# Patient Record
Sex: Female | Born: 1990 | Hispanic: Yes | Smoking: Never smoker
Health system: Southern US, Community
[De-identification: ages and names within clinical notes are randomized; demographics above are authoritative.]

---

## 2010-05-09 ENCOUNTER — Ambulatory Visit: Payer: Self-pay | Admitting: Family Medicine

## 2010-08-18 IMAGING — US US OB US >=[ID] SNGL FETUS
1 series · 17 of 28 positions shown · non-contrast
Comparison: none

REASON FOR EXAM: anatomy  dating
COMMENTS:

[Series 1: us ob us >=(id) sngl fetus · 17 of 96 slices shown]
[im 1/96]
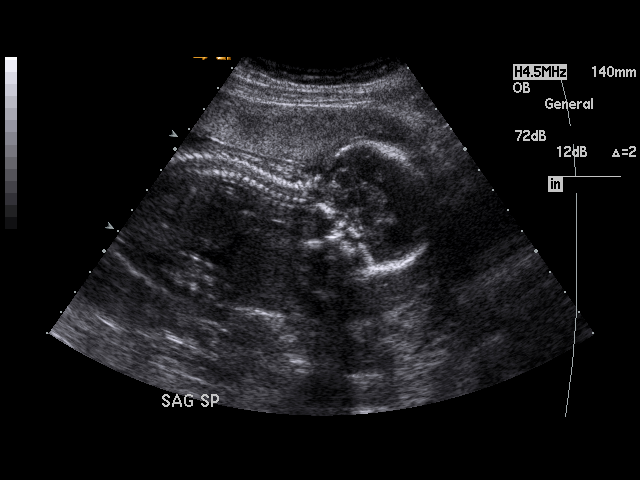
[im 8/96]
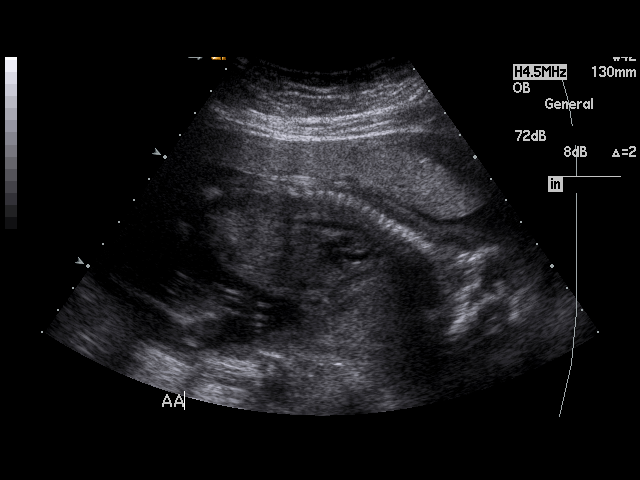
[im 15/96]
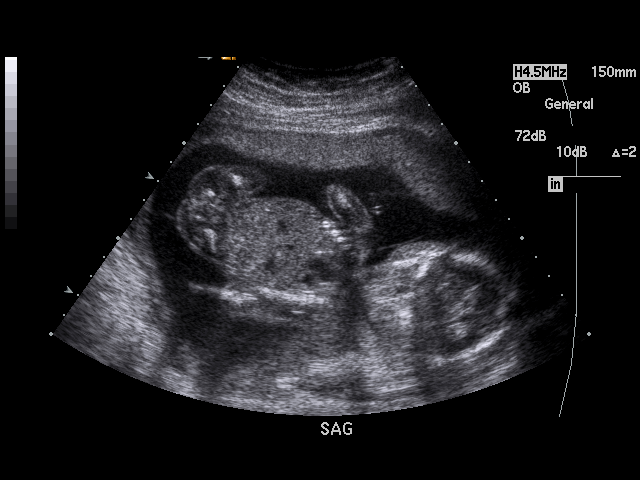
[im 18/96]
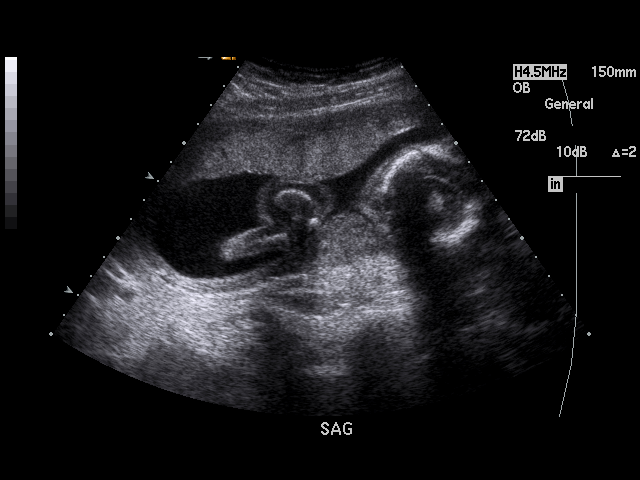
[im 25/96]
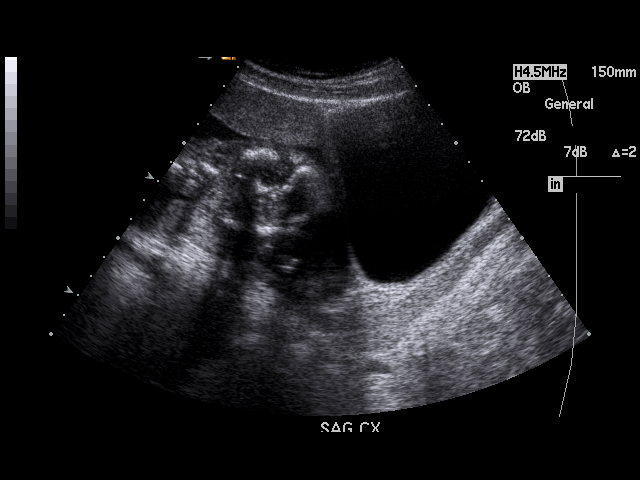
[im 32/96]
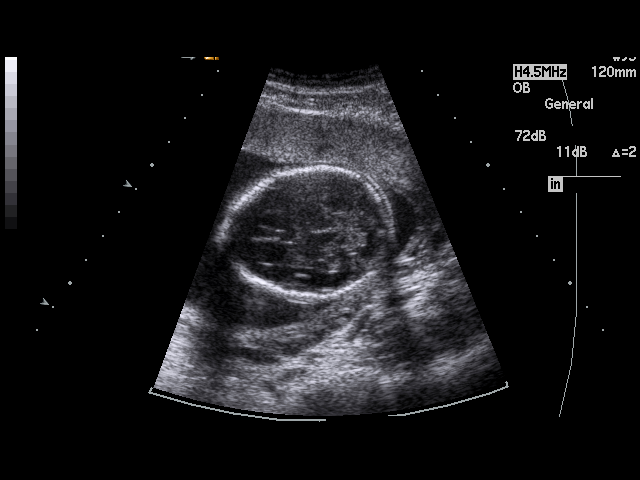
[im 36/96]
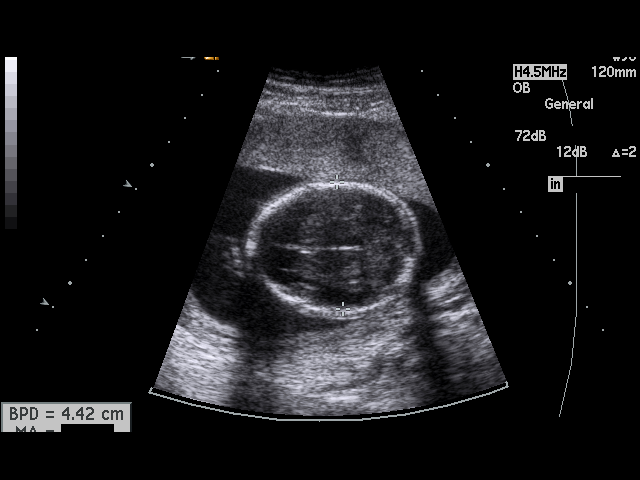
[im 43/96]
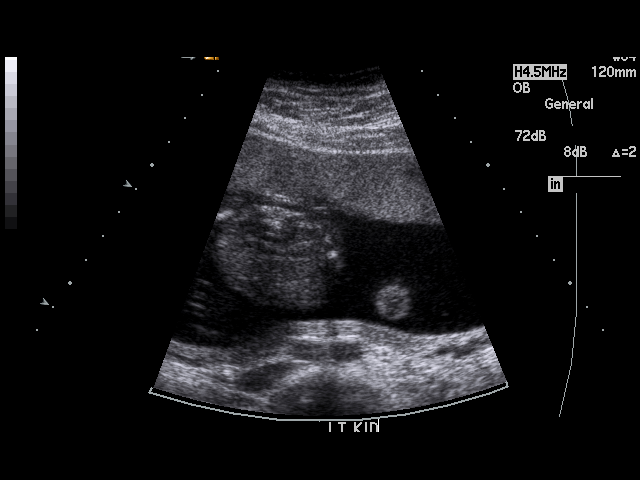
[im 50/96]
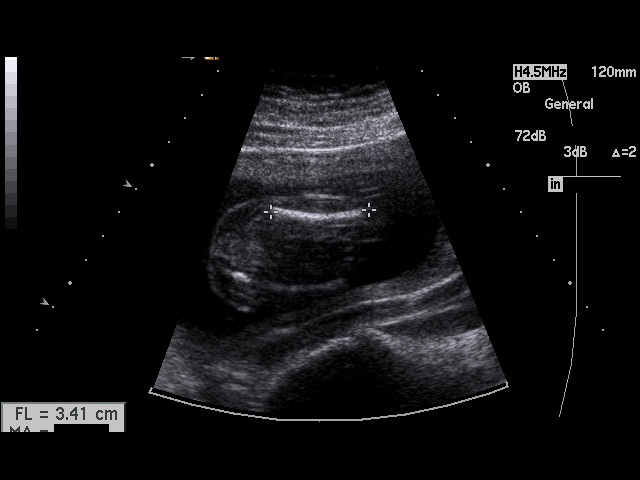
[im 53/96]
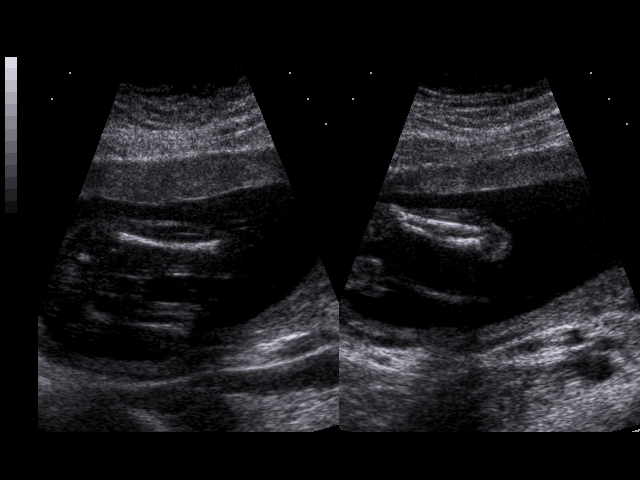
[im 60/96]
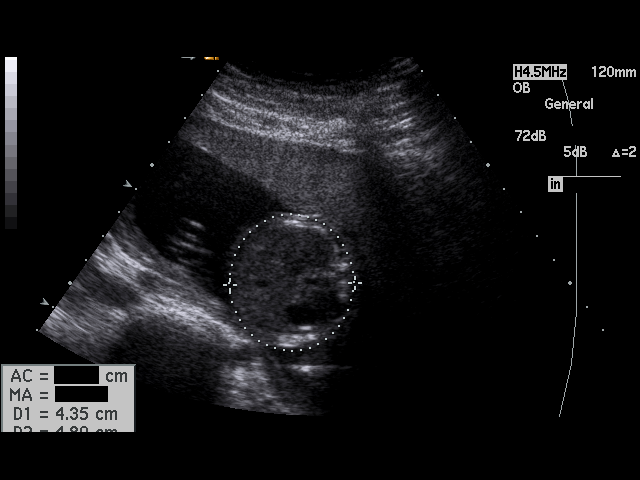
[im 64/96]
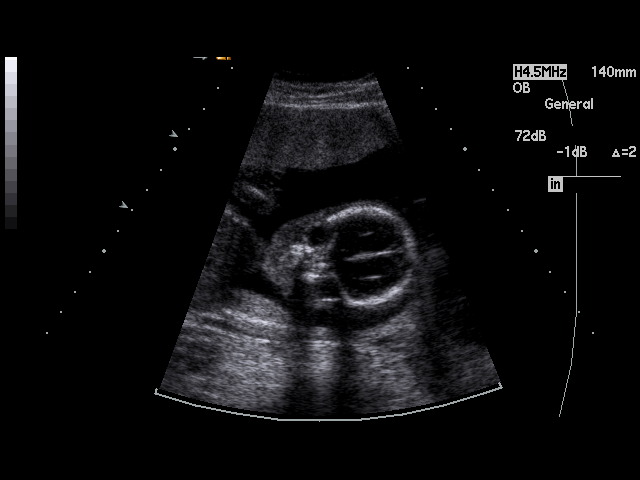
[im 71/96]
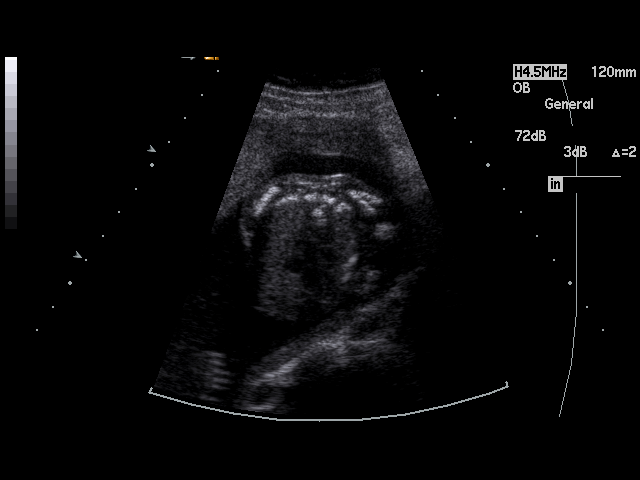
[im 78/96]
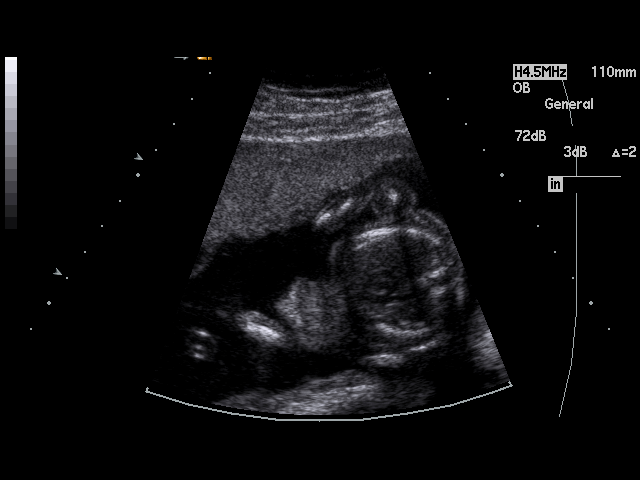
[im 81/96]
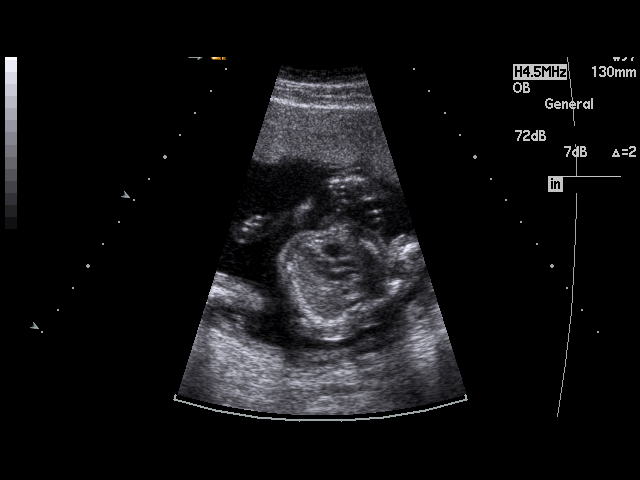
[im 88/96]
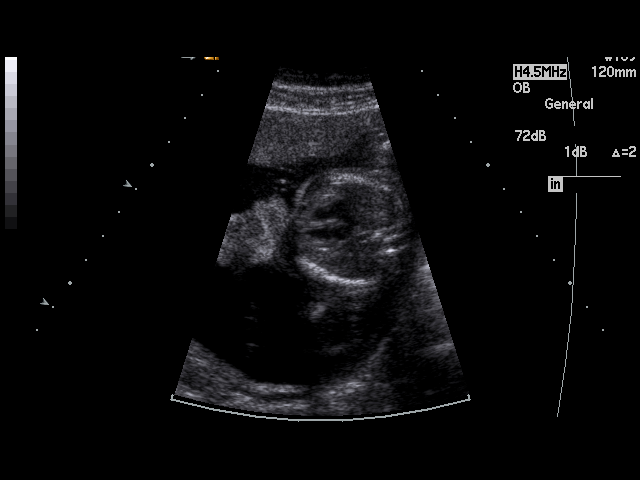
[im 96/96]
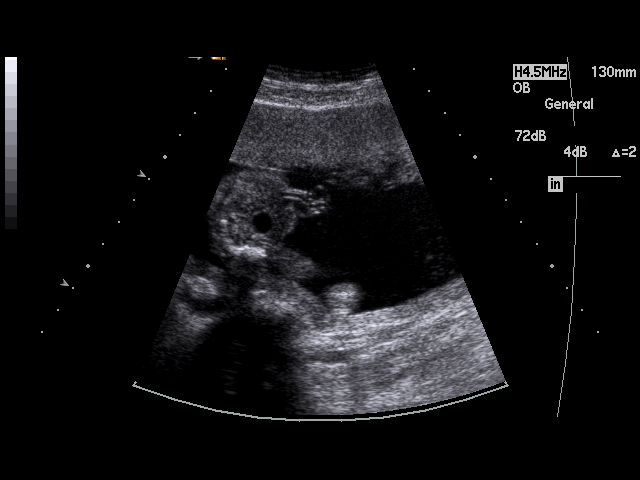

[17 of 28 positions shown; findings below may reference images not displayed]

PROCEDURE:     US  - US OB GREATER/OR EQUAL TO 6HIQ0  - May 09, 2010 [DATE]

RESULT:     There is observed a single living intrauterine gestation.
Presentation currently is cephalic. Amniotic fluid volume appears normal.
The placenta is anterior. The inferior margin of the placenta is
approximately 6.68 cm from the cervix. Fetal heart rate was monitored at 144
beats per minute. The fetal heart, stomach and urinary bladder are
visualized. No hydrocephalus or hydronephrosis is seen. No fetal
abnormalities are identified.

Fetal measurements are as follows:

          BPD is 4.45 cm, corresponding to 19 weeks, 3 days
            HC is 17.34 cm, corresponding to 19 weeks, 6 days
           AC is 14.73 cm, corresponding to 20 weeks, 0 days.
           FL is 3.33 cm, corresponding to 20 weeks, 3 days

EFW is 359 grams + / - 48 grams. Average ultrasound age is 20 weeks, 0 days.
Ultrasound EDD is September 26, 2010.
IMPRESSION: Please see above.

## 2010-09-29 ENCOUNTER — Inpatient Hospital Stay: Payer: Self-pay

## 2020-06-17 ENCOUNTER — Ambulatory Visit (INDEPENDENT_AMBULATORY_CARE_PROVIDER_SITE_OTHER): Payer: PRIVATE HEALTH INSURANCE | Admitting: Obstetrics

## 2020-06-17 ENCOUNTER — Encounter: Payer: Self-pay | Admitting: Obstetrics

## 2020-06-17 ENCOUNTER — Other Ambulatory Visit: Payer: Self-pay

## 2020-06-17 VITALS — BP 120/80 | Ht 67.0 in | Wt 152.0 lb

## 2020-06-17 DIAGNOSIS — Z3041 Encounter for surveillance of contraceptive pills: Secondary | ICD-10-CM | POA: Diagnosis not present

## 2020-06-17 DIAGNOSIS — R300 Dysuria: Secondary | ICD-10-CM | POA: Insufficient documentation

## 2020-06-17 DIAGNOSIS — B373 Candidiasis of vulva and vagina: Secondary | ICD-10-CM | POA: Diagnosis not present

## 2020-06-17 DIAGNOSIS — N898 Other specified noninflammatory disorders of vagina: Secondary | ICD-10-CM | POA: Insufficient documentation

## 2020-06-17 DIAGNOSIS — B3731 Acute candidiasis of vulva and vagina: Secondary | ICD-10-CM | POA: Insufficient documentation

## 2020-06-17 LAB — POCT WET PREP (WET MOUNT)
Clue Cells Wet Prep Whiff POC: NEGATIVE
Trichomonas Wet Prep HPF POC: ABSENT

## 2020-06-17 LAB — POCT URINALYSIS DIPSTICK
Bilirubin, UA: NEGATIVE
Blood, UA: POSITIVE
Glucose, UA: NEGATIVE
Ketones, UA: NEGATIVE
Leukocytes, UA: NEGATIVE
Nitrite, UA: NEGATIVE
Protein, UA: NEGATIVE
Spec Grav, UA: 1.01 (ref 1.010–1.025)
Urobilinogen, UA: 0.2 E.U./dL
pH, UA: 5 (ref 5.0–8.0)

## 2020-06-17 MED ORDER — NORGESTIM-ETH ESTRAD TRIPHASIC 0.18/0.215/0.25 MG-35 MCG PO TABS: 1.0000 | ORAL_TABLET | Freq: Every day | ORAL | 6 refills | Status: DC

## 2020-06-17 MED ORDER — FLUCONAZOLE 150 MG PO TABS: 150.0000 mg | ORAL_TABLET | Freq: Once | ORAL | 0 refills | Status: AC

## 2020-06-17 NOTE — Progress Notes (Signed)
Obstetrics & Gynecology Office Visit   Chief Complaint:  Chief Complaint  Patient presents with  . Vaginitis    History of Present Illness: Michelle Clarke is a 29 yo new patient who presents with c/o some vaginal discharge, and postcoital irritation. She shares that she contracepts with a low dose OCP, and would like this renewed. 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 She has one child at home.She has not established care with a local GYN nor with a PCP. It has been many years since she had a gyn physical- since her child of 9 years was born. In addition  To asking for evaluation of possible yeast infection, she reports that post IC, she occasional has dysuria.   Review of Systems:  Review of Systems  Constitutional: Negative.   HENT: Negative.   Eyes: Negative.   Respiratory: Negative.   Cardiovascular: Negative.   Gastrointestinal: Negative.   Genitourinary: Positive for dysuria.  Skin: Negative.   Neurological: Negative.   Endo/Heme/Allergies: Negative.   Psychiatric/Behavioral: Negative.      Past Medical History:  History reviewed. No pertinent past medical history.  Past Surgical History:  History reviewed. No pertinent surgical history.  Gynecologic History: Patient's last menstrual period was 05/16/2020.  Obstetric History: G1P1001  Family History:  History reviewed. No pertinent family history.  Social History:  Social History   Socioeconomic History  . Marital status: Single    Spouse name: Not on file  . Number of children: Not on file  . Years of education: Not on file  . Highest education level: Not on file  Occupational History  . Not on file  Tobacco Use  . Smoking status: Never Smoker  . Smokeless tobacco: Never Used  Vaping Use  . Vaping Use: Never used  Substance and Sexual Activity  . Alcohol use: Never  . Drug use: Never  . Sexual activity: Yes    Birth control/protection: Pill  Other  Topics Concern  . Not on file  Social History Narrative  . Not on file   Social Determinants of Health   Financial Resource Strain:   . Difficulty of Paying Living Expenses:   Food Insecurity:   . Worried About Charity fundraiser in the Last Year:   . Arboriculturist in the Last Year:   Transportation Needs:   . Film/video editor (Medical):   Marland Kitchen Lack of Transportation (Non-Medical):   Physical Activity:   . Days of Exercise per Week:   . Minutes of Exercise per Session:   Stress:   . Feeling of Stress :   Social Connections:   . Frequency of Communication with Friends and Family:   . Frequency of Social Gatherings with Friends and Family:   . Attends Religious Services:   . Active Member of Clubs or Organizations:   . Attends Archivist Meetings:   Marland Kitchen Marital Status:   Intimate Partner Violence:   . Fear of Current or Ex-Partner:   . Emotionally Abused:   Marland Kitchen Physically Abused:   . Sexually Abused:     Allergies:  No Known Allergies  Medications: Prior to Admission medications   Medication Sig Start Date End Date Taking? Authorizing Provider  Norgestimate-Ethinyl Estradiol Triphasic (TRI-ESTARYLLA) 0.18/0.215/0.25 MG-35 MCG tablet Take 1 tablet by mouth daily. 06/17/20  Yes Imagene Riches, CNM  fluconazole (DIFLUCAN) 150 MG tablet Take 1 tablet (150 mg total) by mouth once for 1  dose. Can take additional dose three days later if symptoms persist 06/17/20 06/17/20  [name], CNM    Physical Exam Vitals:  Vitals:   06/17/20 1314  BP: 120/80   Patient's last menstrual period was 05/16/2020.  Physical Exam  Reserved to the pelvic area: Normal hair distrubution/ shaves her mons. No rashes or lesions noted on external genitalia. No externally noted discharge. Small superficial laceration noted at base of her vagina. Vaginal mucosa is pink, not ruddy. Scant amount of white discharge noted in vaginal vault; non malodorous.  See POCT wet mount  results: yeast buds noted, rare hyphae. No trich, negative whiff. Urine dip reveals some leuks, + hematuria   Assessment: 29 y.o. G1P1001, new patient for evaluation of vaginal discharge. Yeast vaginitis Desires STI screening for GC, CT  Plan: Problem List Items Addressed This Visit      Genitourinary   Yeast vaginitis   Relevant Medications   fluconazole (DIFLUCAN) 150 MG tablet     Other   Vaginal discharge - Primary   Relevant Medications   fluconazole (DIFLUCAN) 150 MG tablet   Other Relevant Orders   POCT Wet Prep [facility]) (Completed)   POCT urinalysis dipstick (Completed)   GC/Chlamydia Probe Amp   Dysuria   Relevant Orders   Culture, OB Urine    Other Visit Diagnoses    Encounter for surveillance of contraceptive pills       Relevant Medications   Norgestimate-Ethinyl Estradiol Triphasic (TRI-ESTARYLLA) 0.18/0.215/0.25 MG-35 MCG tablet     P: Aptima probe sent. Wet mount performed. Rx for her OCPs renewed for 6 months. Urine sent for culture. Will follow up with Rx if culture is + Encouraged to make appointment for an annual Gyn PE. [name], CNM  06/17/2020 4:41 PM

## 2020-06-19 LAB — CULTURE, OB URINE

## 2020-06-19 LAB — URINE CULTURE, OB REFLEX: Organism ID, Bacteria: NO GROWTH

## 2020-06-20 LAB — GC/CHLAMYDIA PROBE AMP
Chlamydia trachomatis, NAA: NEGATIVE
Neisseria Gonorrhoeae by PCR: NEGATIVE

## 2020-07-08 ENCOUNTER — Ambulatory Visit: Payer: PRIVATE HEALTH INSURANCE | Admitting: Obstetrics

## 2020-08-26 ENCOUNTER — Encounter: Payer: Self-pay | Admitting: Obstetrics

## 2020-08-26 ENCOUNTER — Other Ambulatory Visit: Payer: Self-pay

## 2020-08-26 ENCOUNTER — Ambulatory Visit (INDEPENDENT_AMBULATORY_CARE_PROVIDER_SITE_OTHER): Payer: No Typology Code available for payment source | Admitting: Obstetrics

## 2020-08-26 ENCOUNTER — Other Ambulatory Visit (HOSPITAL_COMMUNITY)
Admission: RE | Admit: 2020-08-26 | Discharge: 2020-08-26 | Disposition: A | Discharge status: Home / Self Care | Payer: PRIVATE HEALTH INSURANCE | Source: Ambulatory Visit | Attending: Obstetrics | Admitting: Obstetrics

## 2020-08-26 VITALS — BP 110/80 | Ht 67.0 in | Wt 160.0 lb

## 2020-08-26 DIAGNOSIS — Z01419 Encounter for gynecological examination (general) (routine) without abnormal findings: Secondary | ICD-10-CM

## 2020-08-26 DIAGNOSIS — Z3041 Encounter for surveillance of contraceptive pills: Secondary | ICD-10-CM | POA: Diagnosis not present

## 2020-08-26 DIAGNOSIS — Z23 Encounter for immunization: Secondary | ICD-10-CM | POA: Diagnosis not present

## 2020-08-26 MED ORDER — NORGESTIM-ETH ESTRAD TRIPHASIC 0.18/0.215/0.25 MG-35 MCG PO TABS: 1.0000 | ORAL_TABLET | Freq: Every day | ORAL | 6 refills | Status: DC

## 2020-08-26 NOTE — Progress Notes (Signed)
Gynecology Annual Exam  PCP: Patient, No Pcp Per  Chief Complaint:  Chief Complaint  Patient presents with  . Gynecologic Exam    no concerns    History of Present Illness:  Ms. Michelle Clarke is a 29 y.o. G1P1001 who LMP was Patient's last menstrual period was 08/15/2020 (exact date)., presents today for her annual examination.  Her menses are regular every 28-30 days, lasting 4 day(s).  Dysmenorrhea none. She doesnot have intermenstrual bleeding.  She is single partner, contraception - OCP (estrogen/progesterone).  Last Pap: not recorded  Results were: NA  Hx of STDs: none  There is no FH of breast cancer. There is no FH of ovarian cancer. The patient does do self-breast exams.  Tobacco use: The patient denies current or previous tobacco use. Alcohol use: social drinker Exercise: moderately active    The patient wears seatbelts: yes.   The patient reports that domestic violence in her life is absent.   History reviewed. No pertinent past medical history.  History reviewed. No pertinent surgical history.  Prior to Admission medications   Medication Sig Start Date End Date Taking? Authorizing Provider  Norgestimate-Ethinyl Estradiol Triphasic (TRI-ESTARYLLA) 0.18/0.215/0.25 MG-35 MCG tablet Take 1 tablet by mouth daily. 08/26/20  Yes [name], CNM    No Known Allergies  Gynecologic History: Patient's last menstrual period was 08/15/2020 (exact date). History of abnormal pap smear: No History of STI: No   Obstetric History: G1P1001  Social History   Socioeconomic History  . Marital status: Single    Spouse name: Not on file  . Number of children: Not on file  . Years of education: Not on file  . Highest education level: Not on file  Occupational History  . Not on file  Tobacco Use  . Smoking status: Never Smoker  . Smokeless tobacco: Never Used  Vaping Use  . Vaping Use: Never used  Substance and Sexual Activity  . Alcohol use: Never  . Drug use:  Never  . Sexual activity: Yes    Birth control/protection: Pill  Other Topics Concern  . Not on file  Social History Narrative  . Not on file   Social Determinants of Health   Financial Resource Strain:   . Difficulty of Paying Living Expenses: Not on file  Food Insecurity:   . Worried About Programme researcher, broadcasting/film/video in the Last Year: Not on file  . Ran Out of Food in the Last Year: Not on file  Transportation Needs:   . Lack of Transportation (Medical): Not on file  . Lack of Transportation (Non-Medical): Not on file  Physical Activity:   . Days of Exercise per Week: Not on file  . Minutes of Exercise per Session: Not on file  Stress:   . Feeling of Stress : Not on file  Social Connections:   . Frequency of Communication with Friends and Family: Not on file  . Frequency of Social Gatherings with Friends and Family: Not on file  . Attends Religious Services: Not on file  . Active Member of Clubs or Organizations: Not on file  . Attends Banker Meetings: Not on file  . Marital Status: Not on file  Intimate Partner Violence:   . Fear of Current or Ex-Partner: Not on file  . Emotionally Abused: Not on file  . Physically Abused: Not on file  . Sexually Abused: Not on file    History reviewed. No pertinent family history.  ROS   Physical Exam BP 110/80  Ht 5\' 7"  (1.702 m)   Wt 160 lb (72.6 kg)   LMP 08/15/2020 (Exact Date)   BMI 25.06 kg/m    OBGyn Exam  Female chaperone present for pelvic and breast  portions of the physical exam  Results:     Assessment: 29 y.o. G9P1001 female here for routine annual gynecologic examination  Plan: Problem List Items Addressed This Visit    None    Visit Diagnoses    Needs flu shot    -  Primary   Relevant Orders   Flu Vaccine QUAD 36+ mos IM (Fluarix, Quad PF) (Completed)   Women's annual routine gynecological examination       Relevant Orders   Cytology - PAP   Encounter for surveillance of contraceptive  pills       Relevant Medications   Norgestimate-Ethinyl Estradiol Triphasic (TRI-ESTARYLLA) 0.18/0.215/0.25 MG-35 MCG tablet      Screening: -- Blood pressure screen normal -- Weight screening: normal -- Depression screening negative (PHQ-9) -- Nutrition: normal -- cholesterol screening: not due for screening -- osteoporosis screening: due - will order DEXA -- tobacco screening: not using -- alcohol screening: AUDIT questionnaire indicates low-risk usage. -- family history of breast cancer screening: done. not at high risk. -- no evidence of domestic violence or intimate partner violence. -- STD screening: gonorrhea/chlamydia NAAT not collected per patient request. -- pap smear collected per ASCCP guidelines -- flu vaccine given today -- HPV vaccination series: received  01-21-1982, CNM  08/26/2020 4:45 PM   08/26/2020 4:37 PM

## 2020-08-31 ENCOUNTER — Encounter: Payer: Self-pay | Admitting: Obstetrics

## 2020-08-31 LAB — CYTOLOGY - PAP
Comment: NEGATIVE
Diagnosis: UNDETERMINED — AB
High risk HPV: NEGATIVE

## 2020-09-02 ENCOUNTER — Encounter: Payer: Self-pay | Admitting: Obstetrics

## 2020-12-24 NOTE — L&D Delivery Note (Signed)
Date of delivery: 09/19/2021 Estimated Date of Delivery: 09/19/21 Patient's last menstrual period was 01/02/2021 (within days). EGA: [redacted]w[redacted]d  Delivery Note At 3:35 AM a viable female was delivered via Vaginal, Spontaneous  Presentation: Left Occiput Anterior  APGAR: 8, 9   Weight: 3740 g, 8 pounds 4 ounces   Placenta status: Spontaneous, Intact.   Cord: 3 vessels with the following complications: none  Cord pH: NA  Called to see patient.  Mom pushed to deliver a viable female infant.  The head followed by shoulders, which delivered without difficulty, and the rest of the body.  Nuchal cord noted and reduced after delivery.  Baby to mom's chest.  Cord clamped and cut after 3 min delay.  Cord blood obtained.  Placenta delivered spontaneously, intact, with a 3-vessel cord.  First degree perineal laceration hemostatic and not requiring repair.  All counts correct.  Hemostasis obtained with IV pitocin and fundal massage.   Anesthesia: none Episiotomy: none Lacerations: 1st degree Suture Repair:  NA Est. Blood Loss (mL): 200  Mom to postpartum.  Baby to Couplet care / Skin to Skin.  Tresea Mall, CNM 09/19/2021, 4:06 AM

## 2021-03-31 ENCOUNTER — Encounter: Payer: Self-pay | Admitting: Advanced Practice Midwife

## 2021-03-31 ENCOUNTER — Other Ambulatory Visit (HOSPITAL_COMMUNITY)
Admission: RE | Admit: 2021-03-31 | Discharge: 2021-03-31 | Disposition: A | Discharge status: Home / Self Care | Payer: PRIVATE HEALTH INSURANCE | Source: Ambulatory Visit | Attending: Advanced Practice Midwife | Admitting: Advanced Practice Midwife

## 2021-03-31 ENCOUNTER — Ambulatory Visit (INDEPENDENT_AMBULATORY_CARE_PROVIDER_SITE_OTHER): Payer: PRIVATE HEALTH INSURANCE | Admitting: Advanced Practice Midwife

## 2021-03-31 ENCOUNTER — Other Ambulatory Visit: Payer: Self-pay

## 2021-03-31 VITALS — BP 120/80 | Wt 160.0 lb

## 2021-03-31 DIAGNOSIS — Z3481 Encounter for supervision of other normal pregnancy, first trimester: Secondary | ICD-10-CM | POA: Diagnosis not present

## 2021-03-31 DIAGNOSIS — Z3A12 12 weeks gestation of pregnancy: Secondary | ICD-10-CM | POA: Insufficient documentation

## 2021-03-31 DIAGNOSIS — Z113 Encounter for screening for infections with a predominantly sexual mode of transmission: Secondary | ICD-10-CM

## 2021-03-31 DIAGNOSIS — Z130 Encounter for screening for diseases of the blood and blood-forming organs and certain disorders involving the immune mechanism: Secondary | ICD-10-CM

## 2021-03-31 DIAGNOSIS — Z3490 Encounter for supervision of normal pregnancy, unspecified, unspecified trimester: Secondary | ICD-10-CM | POA: Insufficient documentation

## 2021-03-31 DIAGNOSIS — Z369 Encounter for antenatal screening, unspecified: Secondary | ICD-10-CM | POA: Insufficient documentation

## 2021-03-31 DIAGNOSIS — N926 Irregular menstruation, unspecified: Secondary | ICD-10-CM

## 2021-03-31 LAB — POCT URINALYSIS DIPSTICK OB
Glucose, UA: NEGATIVE
POC,PROTEIN,UA: NEGATIVE

## 2021-03-31 LAB — OB RESULTS CONSOLE VARICELLA ZOSTER ANTIBODY, IGG: Varicella: IMMUNE

## 2021-03-31 LAB — POCT URINE PREGNANCY: Preg Test, Ur: POSITIVE — AB

## 2021-03-31 NOTE — Progress Notes (Signed)
New Obstetric Patient H&P    Chief Complaint: "Desires prenatal care"   History of Present Illness: Patient is a 30 y.o. G2P1001 Hispanic or Latino female, presents with amenorrhea and positive home pregnancy test. Patient's last menstrual period was 01/02/2021 (within days). and based on her  LMP, her EDD is Estimated Date of Delivery: 10/09/21 and her EGA is [redacted]w[redacted]d. Cycles are 5 days, regular, and occur approximately every : 28 days. Her last pap smear was 6 months ago and was ASCUS with NEGATIVE high risk HPV.    She had a urine pregnancy test which was positive 4 or 5 week(s)  ago. Her last menstrual period was early and lasted for  5 day(s). Since her LMP she claims she has experienced nausea with daily morning vomiting (when brushing teeth). She denies vaginal bleeding. Her past medical history is noncontributory. Her prior pregnancies are notable for G1 FT SVD female 7#11oz  Since her LMP, she admits to the use of tobacco products  no She claims she has gained no pounds since the start of her pregnancy.  There are cats in the home in the home  yes indoor She admits close contact with children on a regular basis  yes  She has had chicken pox in the past yes She has had Tuberculosis exposures, symptoms, or previously tested positive for TB   no Current or past history of domestic violence. no  Genetic Screening/Teratology Counseling: (Includes patient, baby's father, or anyone in either family with:)   1. Patient's age >/= 24 at [facility]  no 2. Thalassemia ([place], [place], Mediterranean, or Asian background): MCV<80  no 3. Neural tube defect (meningomyelocele, spina bifida, anencephaly)  no 4. Congenital heart defect  no  5. Down syndrome  no 6. Tay-Sachs (Jewish, [place]  no 7. Canavan's Disease  no 8. Sickle cell disease or trait (African)  no  9. Hemophilia or other blood disorders  no  10. Muscular dystrophy  no  11. Cystic fibrosis  no  12. Huntington's Chorea  no  13.  Mental retardation/autism  no 14. Other inherited genetic or chromosomal disorder  no 15. Maternal metabolic disorder (DM, PKU, etc)  no 16. Patient or FOB with a child with a birth defect not listed above no  16a. Patient or FOB with a birth defect themselves no 17. Recurrent pregnancy loss, or stillbirth  no  18. Any medications since LMP other than prenatal vitamins (include vitamins, supplements, OTC meds, drugs, alcohol)  Took OCP until March 1st when she took pregnancy test 19. Any other genetic/environmental exposure to discuss  She works as a Sales executive and uses precautions  Infection History:   1. Lives with someone with TB or TB exposed  no  2. Patient or partner has history of genital herpes  no 3. Rash or viral illness since LMP  no 4. History of STI (GC, CT, HPV, syphilis, HIV)  no 5. History of recent travel :  no  Other pertinent information:  no    Review of Systems:10 point review of systems negative unless otherwise noted in HPI  Past Medical History:  Patient Active Problem List   Diagnosis Date Noted  . Supervision of normal pregnancy 03/31/2021    [facility] Prenatal Labs  Dating  Blood type:     Genetic Screen 1 Screen:    AFP:     Quad:     NIPS: Antibody:   Anatomic [place]  Rubella:    Varicella: @VZVIGG @  GTT Early: NA                Third trimester:  RPR:     Rhogam  HBsAg:     Vaccines TDAP:                       Flu Shot: Covid: HIV:     Baby Food Breast                               GBS:   GC/CT:  Contraception  Pap: ASCUS/neg HR HPV 2021 Needs repeat PAP postpartum  CBB     CS/VBAC NA   Support Person fiance         Past Surgical History:  History reviewed. No pertinent surgical history.  Gynecologic History: Patient's last menstrual period was 01/02/2021 (within days).  Obstetric History: G2P1001  Family History:  History reviewed. No pertinent family history.  Social History:  Social History   Socioeconomic History   . Marital status: Single    Spouse name: Not on file  . Number of children: Not on file  . Years of education: Not on file  . Highest education level: Not on file  Occupational History  . Not on file  Tobacco Use  . Smoking status: Never Smoker  . Smokeless tobacco: Never Used  Vaping Use  . Vaping Use: Never used  Substance and Sexual Activity  . Alcohol use: Never  . Drug use: Never  . Sexual activity: Yes    Birth control/protection: Pill  Other Topics Concern  . Not on file  Social History Narrative  . Not on file   Social Determinants of Health   Financial Resource Strain: Not on file  Food Insecurity: Not on file  Transportation Needs: Not on file  Physical Activity: Not on file  Stress: Not on file  Social Connections: Not on file  Intimate Partner Violence: Not on file    Allergies:  No Known Allergies  Medications: Prior to Admission medications   Medication Sig Start Date End Date Taking? Authorizing Provider  Prenatal Vit-Fe Fumarate-FA (MULTIVITAMIN-PRENATAL) 27-0.8 MG TABS tablet Take 1 tablet by mouth daily at 12 noon.   Yes [provider]  Norgestimate-Ethinyl Estradiol Triphasic (TRI-ESTARYLLA) 0.18/0.215/0.25 MG-35 MCG tablet Take 1 tablet by mouth daily. Patient not taking: Reported on 03/31/2021 08/26/20   [name], CNM    Physical Exam Vitals: Blood pressure 120/80, weight 160 lb (72.6 kg), last menstrual period 01/02/2021.  General: NAD HEENT: normocephalic, anicteric Thyroid: no enlargement, no palpable nodules Pulmonary: No increased work of breathing, CTAB Cardiovascular: RRR, distal pulses 2+ Abdomen: NABS, soft, non-tender, non-distended.  Umbilicus without lesions.  No hepatomegaly, splenomegaly or masses palpable. No evidence of hernia. FHTs 160 Genitourinary:  External: Normal external female genitalia.  Normal urethral meatus, normal Bartholin's and Skene's glands.    Vagina: Normal vaginal mucosa, no evidence of  prolapse.    Cervix: Grossly normal in appearance, no bleeding, no CMT  Uterus: Non-enlarged, mobile, normal contour.    Adnexa: ovaries non-enlarged, no adnexal masses  Rectal: deferred Extremities: no edema, erythema, or tenderness Neurologic: Grossly intact Psychiatric: mood appropriate, affect full   The following were addressed during this visit:  Breastfeeding Education - Early initiation of breastfeeding    Comments: Keeps milk supply adequate, helps contract uterus and slow bleeding, and early milk is the perfect first food and is easy to  digest.   - The importance of exclusive breastfeeding    Comments: Provides antibodies, Lower risk of breast and ovarian cancers, and type-2 diabetes,Helps your body recover, Reduced chance of SIDS.   - Risks of giving your baby anything other than breast milk if you are breastfeeding    Comments: Make the baby less content with breastfeeds, may make my baby more susceptible to illness, and may reduce my milk supply.   - The importance of early skin-to-skin contact    Comments: Keeps baby warm and secure, helps keep baby's blood sugar up and breathing steady, easier to bond and breastfeed, and helps calm baby.  - Rooming-in on a 24-hour basis    Comments: Easier to learn baby's feeding cues, easier to bond and get to know each other, and encourages milk production.   - Feeding on demand or baby-led feeding    Comments: Helps prevent breastfeeding complications, helps bring in good milk supply, prevents under or overfeeding, and helps baby feel content and satisfied   - Frequent feeding to help assure optimal milk production    Comments: Making a full supply of milk requires frequent removal of milk from breasts, infant will eat 8-12 times in 24 hours, if separated from infant use breast massage, hand expression and/ or pumping to remove milk from breasts.   - Effective positioning and attachment    Comments: Helps my baby to get  enough breast milk, helps to produce an adequate milk supply, and helps prevent nipple pain and damage   - Exclusive breastfeeding for the first 6 months    Comments: Builds a healthy milk supply and keeps it up, protects baby from sickness and disease, and breastmilk has everything your baby needs for the first 6 months.  - Individualized Education    Comments: Contraindications to breastfeeding and other special medical conditions Has breastfeeding experience for 1 year with G1. She admits decreased milk production and supplemented with formula. Also admits calorie limiting for weight loss. We discussed the importance of adequate hydration and calories for optimum production.    Assessment: 30 y.o. G2P1001 at [redacted]w[redacted]d presenting to initiate prenatal care  Plan: 1) Avoid alcoholic beverages. 2) Patient encouraged not to smoke.  3) Discontinue the use of all non-medicinal drugs and chemicals.  4) Take prenatal vitamins daily.  5) Nutrition, food safety (fish, cheese advisories, and high nitrite foods) and exercise discussed. 6) Hospital and practice style discussed with cross coverage system.  7) Genetic Screening, such as with 1st Trimester Screening, cell free fetal DNA, AFP testing, and Ultrasound, as well as with amniocentesis and CVS as appropriate, is discussed with patient. At the conclusion of today's visit patient requested genetic testing. She will check coverage with insurance. 8) Patient is asked about travel to areas at risk for the Zika virus, and counseled to avoid travel and exposure to mosquitoes or sexual partners who may have themselves been exposed to the virus. Testing is discussed, and will be ordered as appropriate.  9) Urine culture, aptima, NOB panel, sickle cell screen today 10) Return in 1 week for dating scan and ROB 11) MaterniT 21 as desired/covered by insurance   Tresea Mall, CNM [facility] 03/31/2021, 9:31 AM

## 2021-03-31 NOTE — Patient Instructions (Signed)

## 2021-04-02 LAB — URINE CULTURE

## 2021-04-03 LAB — RPR+RH+ABO+RUB AB+AB SCR+CB...
Antibody Screen: NEGATIVE
HIV Screen 4th Generation wRfx: NONREACTIVE
Hematocrit: 37.1 % (ref 34.0–46.6)
Hemoglobin: 12.8 g/dL (ref 11.1–15.9)
Hepatitis B Surface Ag: NEGATIVE
MCH: 30.4 pg (ref 26.6–33.0)
MCHC: 34.5 g/dL (ref 31.5–35.7)
MCV: 88 fL (ref 79–97)
Platelets: 299 10*3/uL (ref 150–450)
RBC: 4.21 x10E6/uL (ref 3.77–5.28)
RDW: 13.3 % (ref 11.7–15.4)
RPR Ser Ql: NONREACTIVE
Rh Factor: POSITIVE
Rubella Antibodies, IGG: 1.57 index (ref >0.99)
Varicella zoster IgG: 714 index (ref >165)
WBC: 9.4 10*3/uL (ref 3.4–10.8)

## 2021-04-03 LAB — HGB FRACTIONATION CASCADE
Hgb A2: 2.8 % (ref 1.8–3.2)
Hgb A: 97.2 % (ref 96.4–98.8)
Hgb F: 0 % (ref 0.0–2.0)
Hgb S: 0 %

## 2021-04-03 LAB — CERVICOVAGINAL ANCILLARY ONLY
Chlamydia: NEGATIVE
Comment: NEGATIVE
Comment: NEGATIVE
Comment: NORMAL
Neisseria Gonorrhea: NEGATIVE
Trichomonas: NEGATIVE

## 2021-04-14 ENCOUNTER — Encounter: Payer: PRIVATE HEALTH INSURANCE | Admitting: Obstetrics & Gynecology

## 2021-04-21 ENCOUNTER — Encounter: Payer: Self-pay | Admitting: Obstetrics & Gynecology

## 2021-04-21 ENCOUNTER — Ambulatory Visit (INDEPENDENT_AMBULATORY_CARE_PROVIDER_SITE_OTHER): Payer: No Typology Code available for payment source | Admitting: Obstetrics & Gynecology

## 2021-04-21 ENCOUNTER — Other Ambulatory Visit: Payer: Self-pay

## 2021-04-21 VITALS — BP 120/70 | Wt 160.0 lb

## 2021-04-21 DIAGNOSIS — Z3A18 18 weeks gestation of pregnancy: Secondary | ICD-10-CM | POA: Diagnosis not present

## 2021-04-21 DIAGNOSIS — N926 Irregular menstruation, unspecified: Secondary | ICD-10-CM | POA: Diagnosis not present

## 2021-04-21 DIAGNOSIS — Z3482 Encounter for supervision of other normal pregnancy, second trimester: Secondary | ICD-10-CM

## 2021-04-21 NOTE — Patient Instructions (Addendum)
Thank you for choosing [facility]. As part of our ongoing efforts to improve patient experience, we would appreciate your feedback. Please fill out the short survey that you will receive by mail or MyChart. Your opinion is important to [place]! -Dr Tiburcio Pea  DUE DATE 09/19/2021 !  Second Trimester of Pregnancy  The second trimester of pregnancy is from week 13 through week 27. This is months 4 through 6 of pregnancy. The second trimester is often a time when you feel your best. Your body has adjusted to being pregnant, and you begin to feel better physically. During the second trimester:  Morning sickness has lessened or stopped completely.  You may have more energy.  You may have an increase in appetite. The second trimester is also a time when the unborn baby (fetus) is growing rapidly. At the end of the sixth month, the fetus may be up to 12 inches long and weigh about 1 pounds. You will likely begin to feel the baby move (quickening) between 16 and 20 weeks of pregnancy. Body changes during your second trimester Your body continues to go through many changes during your second trimester. The changes vary and generally return to normal after the baby is born. Physical changes  Your weight will continue to increase. You will notice your lower abdomen bulging out.  You may begin to get stretch marks on your hips, abdomen, and breasts.  Your breasts will continue to grow and to become tender.  Dark spots or blotches (chloasma or mask of pregnancy) may develop on your face.  A dark line from your belly button to the pubic area (linea nigra) may appear.  You may have changes in your hair. These can include thickening of your hair, rapid growth, and changes in texture. Some people also have hair loss during or after pregnancy, or hair that feels dry or thin. Health changes  You may develop headaches.  You may have heartburn.  You may develop constipation.  You may develop hemorrhoids  or swollen, bulging veins (varicose veins).  Your gums may bleed and may be sensitive to brushing and flossing.  You may urinate more often because the fetus is pressing on your bladder.  You may have back pain. This is caused by: ? Weight gain. ? Pregnancy hormones that are relaxing the joints in your pelvis. ? A shift in weight and the muscles that support your balance. Follow these instructions at home: Medicines  Follow your health care provider's instructions regarding medicine use. Specific medicines may be either safe or unsafe to take during pregnancy. Do not take any medicines unless approved by your health care provider.  Take a prenatal vitamin that contains at least 600 micrograms (mcg) of folic acid. Eating and drinking  Eat a healthy diet that includes fresh fruits and vegetables, whole grains, good sources of protein such as meat, eggs, or tofu, and low-fat dairy products.  Avoid raw meat and unpasteurized juice, milk, and cheese. These carry germs that can harm you and your baby.  You may need to take these actions to prevent or treat constipation: ? Drink enough fluid to keep your urine pale yellow. ? Eat foods that are high in fiber, such as beans, whole grains, and fresh fruits and vegetables. ? Limit foods that are high in fat and processed sugars, such as fried or sweet foods. Activity  Exercise only as directed by your health care provider. Most people can continue their usual exercise routine during pregnancy. Try to exercise  for 30 minutes at least 5 days a week. Stop exercising if you develop contractions in your uterus.  Stop exercising if you develop pain or cramping in the lower abdomen or lower back.  Avoid exercising if it is very hot or humid or if you are at a high altitude.  Avoid heavy lifting.  If you choose to, you may have sex unless your health care provider tells you not to. Relieving pain and discomfort  Wear a supportive bra to prevent  discomfort from breast tenderness.  Take warm sitz baths to soothe any pain or discomfort caused by hemorrhoids. Use hemorrhoid cream if your health care provider approves.  Rest with your legs raised (elevated) if you have leg cramps or low back pain.  If you develop varicose veins: ? Wear support hose as told by your health care provider. ? Elevate your feet for 15 minutes, 3-4 times a day. ? Limit salt in your diet. Safety  Wear your seat belt at all times when driving or riding in a car.  Talk with your health care provider if someone is verbally or physically abusive to you. Lifestyle  Do not use hot tubs, steam rooms, or saunas.  Do not douche. Do not use tampons or scented sanitary pads.  Avoid cat litter boxes and soil used by cats. These carry germs that can cause birth defects in the baby and possibly loss of the fetus by miscarriage or stillbirth.  Do not use herbal remedies, alcohol, illegal drugs, or medicines that are not approved by your health care provider. Chemicals in these products can harm your baby.  Do not use any products that contain nicotine or tobacco, such as cigarettes, e-cigarettes, and chewing tobacco. If you need help quitting, ask your health care provider. General instructions  During a routine prenatal visit, your health care provider will do a physical exam and other tests. He or she will also discuss your overall health. Keep all follow-up visits. This is important.  Ask your health care provider for a referral to a local prenatal education class.  Ask for help if you have counseling or nutritional needs during pregnancy. Your health care provider can offer advice or refer you to specialists for help with various needs. Where to find more information  American Pregnancy Association: americanpregnancy.org  [employer]: https://www.todd-brady.net/  Office on [employer] [facility]:  [employer] Contact a health care provider if you have:  A headache that does not go away when you take medicine.  Vision changes or you see spots in front of your eyes.  Mild pelvic cramps, pelvic pressure, or nagging pain in the abdominal area.  Persistent nausea, vomiting, or diarrhea.  A bad-smelling vaginal discharge or foul-smelling urine.  Pain when you urinate.  Sudden or extreme swelling of your face, hands, ankles, feet, or legs.  A fever. Get help right away if you:  Have fluid leaking from your vagina.  Have spotting or bleeding from your vagina.  Have severe abdominal cramping or pain.  Have difficulty breathing.  Have chest pain.  Have fainting spells.  Have not felt your baby move for the time period told by your health care provider.  Have new or increased pain, swelling, or redness in an arm or leg. Summary  The second trimester of pregnancy is from week 13 through week 27 (months 4 through 6).  Do not use herbal remedies, alcohol, illegal drugs, or medicines that are not approved by your health care provider.  Chemicals in these products can harm your baby.  Exercise only as directed by your health care provider. Most people can continue their usual exercise routine during pregnancy.  Keep all follow-up visits. This is important. This information is not intended to replace advice given to you by your health care provider. Make sure you discuss any questions you have with your health care provider. Document Revised: 05/18/2020 Document Reviewed: 03/24/2020 Elsevier Patient Education  2021 [employer].

## 2021-04-21 NOTE — Progress Notes (Signed)
ULTRASOUND REPORT  Location: [facility] Date of Service: 04/21/2021   Indications:dating Findings:  Mason Jim intrauterine pregnancy is visualized with a BIOMETERY (FL, HC, BPD, AC) consistent with [redacted]w[redacted]d gestation, giving an (U/S) EDD of 09/19/2021. The (U/S) EDD is not consistent with the clinically established EDD of 10/09/2021.  FHR: 150 BPM BPD 39.1 mm HC 15.12 AC 12.27 FL3.05  Right Ovary is normal in appearance. Left Ovary is normal appearance. Corpus luteal cyst:  is not visualized Survey of the adnexa demonstrates no adnexal masses. There is no free peritoneal fluid in the cul de sac.  Impression: 1. [redacted]w[redacted]d Viable Singleton Intrauterine pregnancy by U/S. 2. (U/S) EDD is consistent with Clinically established EDD of 09/19/21.  Recommendations: 1.Clinical correlation with the patient's History and Physical Exam. 2. Will change EDC 3. Plan Anatomy US after 19 weeks  Letitia Libra, MD

## 2021-04-21 NOTE — Progress Notes (Signed)
  Subjective  Fetal Movement? no Nausea? no Leaking Fluid? no Vaginal Bleeding? no  Objective  BP 120/70   Wt 160 lb (72.6 kg)   LMP 01/02/2021 (Within Days)   BMI 25.06 kg/m  General: NAD Pumonary: no increased work of breathing Abdomen: gravid, non-tender Extremities: no edema Psychiatric: mood appropriate, affect full  Assessment  30 y.o. G2P1001 at [redacted]w[redacted]d by  09/19/2021, by Ultrasound presenting for routine prenatal visit  Plan   Problem List Items Addressed This Visit      Other   Supervision of normal pregnancy - Primary    Other Visit Diagnoses    Missed period       [redacted] weeks gestation of pregnancy          pregnancy2 Problems (from 01/02/21 to present)    Problem Noted Resolved   Supervision of normal pregnancy 03/31/2021 by Tresea Mall, CNM No   Overview Addendum 04/21/2021  8:47 AM by Nadara Mustard, MD    Clinic [facility] Prenatal Labs  Dating LMP Blood type: O/Positive/-- (04/08 0917)   Genetic Screen   NIPS:decl Antibody:Negative (04/08 0917)  Anatomic [place]  Rubella: 1.57 (04/08 0917)  Varicella: Imm  GTT Early: NA                Third trimester:  RPR: Non Reactive (04/08 0917)   Rhogam n/a HBsAg: Negative (04/08 0917)   Vaccines TDAP:                       Flu Shot: Covid: HIV: Non Reactive (04/08 0917)   Baby Food Breast                               GBS:   GC/CT:neg  Contraception  Pap: ASCUS/neg HR HPV 2021 Needs repeat PAP postpartum  CBB  no   CS/VBAC NA   Support Person fiance          Previous Version     See [place] report       CHange EDC to 09/19/2021  ANAT [place] next month  PNV  Annamarie Major, MD, [facility], [facility] 04/21/2021  9:09 AM

## 2021-05-18 ENCOUNTER — Other Ambulatory Visit: Payer: Self-pay

## 2021-05-18 ENCOUNTER — Ambulatory Visit (INDEPENDENT_AMBULATORY_CARE_PROVIDER_SITE_OTHER): Payer: No Typology Code available for payment source | Admitting: Obstetrics

## 2021-05-18 VITALS — BP 114/70 | Ht 67.0 in | Wt 164.0 lb

## 2021-05-18 DIAGNOSIS — Z3A22 22 weeks gestation of pregnancy: Secondary | ICD-10-CM

## 2021-05-18 DIAGNOSIS — Z3482 Encounter for supervision of other normal pregnancy, second trimester: Secondary | ICD-10-CM

## 2021-05-18 LAB — POCT URINALYSIS DIPSTICK OB
Glucose, UA: NEGATIVE
POC,PROTEIN,UA: NEGATIVE

## 2021-05-18 NOTE — Progress Notes (Signed)
  Routine Prenatal Care Visit  Subjective  Michelle Clarke is a 30 y.o. G2P1001 at [redacted]w[redacted]d being seen today for ongoing prenatal care.  She is currently monitored for the following issues for this low-risk pregnancy and has Supervision of normal pregnancy on their problem list.  ----------------------------------------------------------------------------------- Patient reports no complaints.   Contractions: Not present. Vag. Bleeding: None.   . Leaking Fluid denies.  ----------------------------------------------------------------------------------- The following portions of the patient's history were reviewed and updated as appropriate: allergies, current medications, past family history, past medical history, past social history, past surgical history and problem list. Problem list updated.  Objective  Blood pressure 114/70, height 5\' 7"  (1.702 m), weight 164 lb (74.4 kg), last menstrual period 01/02/2021. Pregravid weight 160 lb (72.6 kg) Total Weight Gain 4 lb (1.814 kg) Urinalysis: Urine Protein    Urine Glucose    Fetal Status:           General:  Alert, oriented and cooperative. Patient is in no acute distress.  Skin: Skin is warm and dry. No rash noted.   Cardiovascular: Normal heart rate noted  Respiratory: Normal respiratory effort, no problems with respiration noted  Abdomen: Soft, gravid, appropriate for gestational age. Pain/Pressure: Absent     Pelvic:  Cervical exam deferred        Extremities: Normal range of motion.     Mental Status: Normal mood and affect. Normal behavior. Normal judgment and thought content.   Assessment   30 y.o. G2P1001 at [redacted]w[redacted]d by  09/19/2021, by Ultrasound presenting for routine prenatal visit  Plan   pregnancy2 Problems (from 01/02/21 to present)    Problem Noted Resolved   Supervision of normal pregnancy 03/31/2021 by 05/31/2021, CNM No   Overview Addendum 04/21/2021  8:47 AM by 04/23/2021, MD    Clinic [facility] Prenatal Labs  Dating  LMP Blood type: O/Positive/-- (04/08 0917)   Genetic Screen   NIPS:decl Antibody:Negative (04/08 0917)  Anatomic 06[birth year]10-2003  Rubella: 1.57 (04/08 0917)  Varicella: Imm  GTT Early: NA                Third trimester:  RPR: Non Reactive (04/08 0917)   Rhogam n/a HBsAg: Negative (04/08 0917)   Vaccines TDAP:                       Flu Shot: Covid: HIV: Non Reactive (04/08 0917)   Baby Food Breast                               GBS:   GC/CT:neg  Contraception  Pap: ASCUS/neg HR HPV 2021 Needs repeat PAP postpartum  CBB  no   CS/VBAC NA   Support Person fiance          Previous Version       Preterm labor symptoms and general obstetric precautions including but not limited to vaginal bleeding, contractions, leaking of fluid and fetal movement were reviewed in detail with the patient. Please refer to After Visit Summary for other counseling recommendations.  She has not had an anatomy scan yet. Ordered today at [redacted] weeks GA.   Return in about 4 weeks (around 06/15/2021) for return OB.  06/17/2021, CNM  05/18/2021 8:40 AM

## 2021-05-19 ENCOUNTER — Encounter: Payer: No Typology Code available for payment source | Admitting: Advanced Practice Midwife

## 2021-06-14 ENCOUNTER — Ambulatory Visit
Admission: RE | Admit: 2021-06-14 | Discharge: 2021-06-14 | Disposition: A | Discharge status: Home / Self Care | Payer: No Typology Code available for payment source | Source: Ambulatory Visit | Attending: Obstetrics | Admitting: Obstetrics

## 2021-06-14 ENCOUNTER — Other Ambulatory Visit: Payer: Self-pay

## 2021-06-14 DIAGNOSIS — Z3482 Encounter for supervision of other normal pregnancy, second trimester: Secondary | ICD-10-CM | POA: Insufficient documentation

## 2021-06-16 ENCOUNTER — Other Ambulatory Visit: Payer: Self-pay

## 2021-06-16 ENCOUNTER — Ambulatory Visit (INDEPENDENT_AMBULATORY_CARE_PROVIDER_SITE_OTHER): Payer: No Typology Code available for payment source | Admitting: Obstetrics

## 2021-06-16 VITALS — BP 118/66 | Wt 169.0 lb

## 2021-06-16 DIAGNOSIS — O99283 Endocrine, nutritional and metabolic diseases complicating pregnancy, third trimester: Secondary | ICD-10-CM

## 2021-06-16 DIAGNOSIS — O4420 Partial placenta previa NOS or without hemorrhage, unspecified trimester: Secondary | ICD-10-CM

## 2021-06-16 DIAGNOSIS — Z3A26 26 weeks gestation of pregnancy: Secondary | ICD-10-CM

## 2021-06-16 DIAGNOSIS — E0590 Thyrotoxicosis, unspecified without thyrotoxic crisis or storm: Secondary | ICD-10-CM

## 2021-06-16 DIAGNOSIS — Z3482 Encounter for supervision of other normal pregnancy, second trimester: Secondary | ICD-10-CM

## 2021-06-16 LAB — POCT URINALYSIS DIPSTICK OB
Glucose, UA: NEGATIVE
POC,PROTEIN,UA: NEGATIVE

## 2021-06-16 NOTE — Progress Notes (Signed)
ROB - no concerns. RM 5 

## 2021-06-16 NOTE — Progress Notes (Signed)
Routine Prenatal Care Visit  Subjective  Michelle Clarke is a 30 y.o. G2P1001 at [redacted]w[redacted]d being seen today for ongoing prenatal care.  She is currently monitored for the following issues for this low-risk pregnancy and has Supervision of normal pregnancy on their problem list.  ----------------------------------------------------------------------------------- Patient reports no complaints.  She just had her anatomy scan yesterday at 26 weeks . Marginal placental previa. She mentions that she was once told that she is hyperthroid- has not had any medication in several years.   .  .   Michelle Clarke denies.  ----------------------------------------------------------------------------------- The following portions of the patient's history were reviewed and updated as appropriate: allergies, current medications, past family history, past medical history, past social history, past surgical history and problem list. Problem list updated.  Objective  Blood pressure 118/66, weight 169 lb (76.7 kg), last menstrual period 01/02/2021. Pregravid weight 160 lb (72.6 kg) Total Weight Gain 9 lb (4.082 kg) Urinalysis: Urine Protein Negative  Urine Glucose Negative  Fetal Status:           General:  Alert, oriented and cooperative. Patient is in no acute distress.  Skin: Skin is warm and dry. No rash noted.   Cardiovascular: Normal heart rate noted  Respiratory: Normal respiratory effort, no problems with respiration noted  Abdomen: Soft, gravid, appropriate for gestational age.       Pelvic:  Cervical exam deferred        Extremities: Normal range of motion.     Mental Status: Normal mood and affect. Normal behavior. Normal judgment and thought content.   Assessment   30 y.o. G2P1001 at [redacted]w[redacted]d by  09/19/2021, by Ultrasound presenting for routine prenatal visit  Plan   pregnancy2 Problems (from 01/02/21 to present)    Problem Noted Resolved   Supervision of normal pregnancy 03/31/2021 by Tresea Mall, CNM No   Overview Addendum 06/16/2021  2:23 PM by [name], CNM    Clinic [facility]ren[facility] Labs  Dating LMP Blood type: O/Positive/-- (04/08 0917)   Genetic Screen Done at 26 weeks- marginal previa- needs rescan Antibody:Negative (04/08 0917)  Anatomic [place]  Rubella: 1.57 (04/08 0917)  Varicella: Imm  GTT Early: NA                Third trimester:  RPR: Non Reactive (04/08 0917)   Rhogam n/a HBsAg: Negative (04/08 0917)   Vaccines TDAP:                       Flu Shot: Covid: HIV: Non Reactive (04/08 0917)   Baby Food Breast                               GBS:   GC/CT:neg  Contraception  Pap: ASCUS/neg HR HPV 2021 Needs repeat PAP postpartum  CBB  no   CS/VBAC NA   Support Person fiance               Preterm labor symptoms and general obstetric precautions including but not limited to vaginal bleeding, contractions, leaking of Clarke and fetal movement were reviewed in detail with the patient. Please refer to After Visit Summary for other counseling recommendations.  28 week labs ordered for next visit. TSH and T4 added to panel. rescan ordered for 32 weeks.   Return in about 2 weeks (around 06/30/2021) for return OB, 1hr GTT labs.[name], CNM  06/16/2021 2:37 PM

## 2021-06-19 ENCOUNTER — Encounter: Payer: Self-pay | Admitting: Obstetrics

## 2021-06-19 DIAGNOSIS — O4420 Partial placenta previa NOS or without hemorrhage, unspecified trimester: Secondary | ICD-10-CM | POA: Insufficient documentation

## 2021-07-07 ENCOUNTER — Other Ambulatory Visit: Payer: No Typology Code available for payment source

## 2021-07-07 ENCOUNTER — Ambulatory Visit (INDEPENDENT_AMBULATORY_CARE_PROVIDER_SITE_OTHER): Payer: No Typology Code available for payment source | Admitting: Obstetrics

## 2021-07-07 ENCOUNTER — Other Ambulatory Visit: Payer: Self-pay

## 2021-07-07 VITALS — BP 118/68 | Wt 175.0 lb

## 2021-07-07 DIAGNOSIS — Z3480 Encounter for supervision of other normal pregnancy, unspecified trimester: Secondary | ICD-10-CM

## 2021-07-07 DIAGNOSIS — Z3A29 29 weeks gestation of pregnancy: Secondary | ICD-10-CM

## 2021-07-07 LAB — POCT URINALYSIS DIPSTICK OB
Glucose, UA: NEGATIVE
POC,PROTEIN,UA: NEGATIVE

## 2021-07-07 NOTE — Progress Notes (Signed)
  Routine Prenatal Care Visit  Subjective  Michelle Clarke is a 30 y.o. G2P1001 at [redacted]w[redacted]d being seen today for ongoing prenatal care.  She is currently monitored for the following issues for this low-risk pregnancy and has Supervision of normal pregnancy and Marginal placenta previa on their problem list.  ----------------------------------------------------------------------------------- Patient reports no complaints.    .  .   [name] denies.  ----------------------------------------------------------------------------------- The following portions of the patient's history were reviewed and updated as appropriate: allergies, current medications, past family history, past medical history, past social history, past surgical history and problem list. Problem list updated.  Objective  Last menstrual period 01/02/2021. Pregravid weight 160 lb (72.6 kg) Total Weight Gain 9 lb (4.082 kg) Urinalysis: Urine Protein    Urine Glucose    Fetal Status:           General:  Alert, oriented and cooperative. Patient is in no acute distress.  Skin: Skin is warm and dry. No rash noted.   Cardiovascular: Normal heart rate noted  Respiratory: Normal respiratory effort, no problems with respiration noted  Abdomen: Soft, gravid, appropriate for gestational age.       Pelvic:  Cervical exam deferred        Extremities: Normal range of motion.     Mental Status: Normal mood and affect. Normal behavior. Normal judgment and thought content.   Assessment   30 y.o. G2P1001 at [redacted]w[redacted]d by  09/19/2021, by Ultrasound presenting for routine prenatal visit  Plan   pregnancy2 Problems (from 01/02/21 to present)     Problem Noted Resolved   Marginal placenta previa 06/19/2021 by [name], CNM No   Overview Signed 06/19/2021  8:34 AM by [name], CNM    6/24- noted on anatomy scan at 26 weeks. Will rescan at 32 wks.      Supervision of normal pregnancy 03/31/2021 by Tresea Mall, CNM No    Overview Addendum 06/19/2021  8:33 AM by [name], CNM    Clinic [facility] Pren[facility] Labs  Dating LMP Blood type: O/Positive/-- (04/08 1610)   Genetic Screen Done at 26 weeks- marginal previa- needs rescan Antibody:Negative (04/08 0917)  Anatomic [place] Normal female- marginal placental previa-1.7cms Rubella: 1.57 (04/08 0917)  Varicella: Imm  GTT Early: NA                Third trimester: 7/15 RPR: Non Reactive (04/08 0917)   Rhogam n/a HBsAg: Negative (04/08 0917)   Vaccines TDAP:                       Flu Shot: Covid: HIV: Non Reactive (04/08 0917)   Baby Food Breast                               GBS:   GC/CT:neg  Contraception  Pap: ASCUS/neg HR HPV 2021 Needs repeat PAP postpartum  CBB  no   CS/VBAC NA   Support Person fiance               Preterm labor symptoms and general obstetric precautions including but not limited to vaginal bleeding, contractions, leaking of fluid and fetal movement were reviewed in detail with the patient. Please refer to After Visit Summary for other counseling recommendations.   Return in about 2 weeks (around 07/21/2021) for return OB.  [name], CNM  07/07/2021 8:34 AM

## 2021-07-07 NOTE — Progress Notes (Signed)
ROB - no concerns. RM 4 °

## 2021-07-08 LAB — 28 WEEK RH+PANEL
Basophils Absolute: 0 10*3/uL (ref 0.0–0.2)
Basos: 1 %
EOS (ABSOLUTE): 0.1 10*3/uL (ref 0.0–0.4)
Eos: 1 %
Gestational Diabetes Screen: 131 mg/dL (ref 65–139)
HIV Screen 4th Generation wRfx: NONREACTIVE
Hematocrit: 33.6 % — ABNORMAL LOW (ref 34.0–46.6)
Hemoglobin: 10.7 g/dL — ABNORMAL LOW (ref 11.1–15.9)
Immature Grans (Abs): 0 10*3/uL (ref 0.0–0.1)
Immature Granulocytes: 0 %
Lymphocytes Absolute: 1.7 10*3/uL (ref 0.7–3.1)
Lymphs: 22 %
MCH: 28.9 pg (ref 26.6–33.0)
MCHC: 31.8 g/dL (ref 31.5–35.7)
MCV: 91 fL (ref 79–97)
Monocytes Absolute: 0.4 10*3/uL (ref 0.1–0.9)
Monocytes: 5 %
Neutrophils Absolute: 5.5 10*3/uL (ref 1.4–7.0)
Neutrophils: 71 %
Platelets: 315 10*3/uL (ref 150–450)
RBC: 3.7 x10E6/uL — ABNORMAL LOW (ref 3.77–5.28)
RDW: 12.9 % (ref 11.7–15.4)
RPR Ser Ql: NONREACTIVE
WBC: 7.6 10*3/uL (ref 3.4–10.8)

## 2021-07-08 LAB — TSH+FREE T4
Free T4: 1.2 ng/dL (ref 0.82–1.77)
TSH: 2.42 u[IU]/mL (ref 0.450–4.500)

## 2021-07-21 ENCOUNTER — Other Ambulatory Visit: Payer: Self-pay

## 2021-07-21 ENCOUNTER — Ambulatory Visit (INDEPENDENT_AMBULATORY_CARE_PROVIDER_SITE_OTHER): Payer: No Typology Code available for payment source | Admitting: Obstetrics

## 2021-07-21 VITALS — BP 114/70 | Ht 67.0 in | Wt 176.4 lb

## 2021-07-21 DIAGNOSIS — Z3480 Encounter for supervision of other normal pregnancy, unspecified trimester: Secondary | ICD-10-CM

## 2021-07-21 DIAGNOSIS — Z3A31 31 weeks gestation of pregnancy: Secondary | ICD-10-CM

## 2021-07-21 DIAGNOSIS — Z23 Encounter for immunization: Secondary | ICD-10-CM

## 2021-07-21 LAB — POCT URINALYSIS DIPSTICK OB
Glucose, UA: NEGATIVE
POC,PROTEIN,UA: NEGATIVE

## 2021-07-21 NOTE — Progress Notes (Signed)
Routine Prenatal Care Visit  Subjective  Michelle Clarke is a 30 y.o. G2P1001 at [redacted]w[redacted]d being seen today for ongoing prenatal care.  She is currently monitored for the following issues for this high-risk pregnancy and has Supervision of normal pregnancy and Marginal placenta previa on their problem list.  ----------------------------------------------------------------------------------- Patient reports no complaints.  She has an ultrasound set up to reivew placental location next week. Contractions: Not present. Vag. Bleeding: None.  Movement: Present. Leaking Fluid denies.  ----------------------------------------------------------------------------------- The following portions of the patient's history were reviewed and updated as appropriate: allergies, current medications, past family history, past medical history, past social history, past surgical history and problem list. Problem list updated.  Objective  Blood pressure 114/70, height 5\' 7"  (1.702 m), weight 176 lb 6.4 oz (80 kg), last menstrual period 01/02/2021. Pregravid weight 160 lb (72.6 kg) Total Weight Gain 16 lb 6.4 oz (7.439 kg) Urinalysis: Urine Protein    Urine Glucose    Fetal Status:     Movement: Present     General:  Alert, oriented and cooperative. Patient is in no acute distress.  Skin: Skin is warm and dry. No rash noted.   Cardiovascular: Normal heart rate noted  Respiratory: Normal respiratory effort, no problems with respiration noted  Abdomen: Soft, gravid, appropriate for gestational age. Pain/Pressure: Absent     Pelvic:  Cervical exam deferred        Extremities: Normal range of motion.     Mental Status: Normal mood and affect. Normal behavior. Normal judgment and thought content.   Assessment   30 y.o. G2P1001 at [redacted]w[redacted]d by  09/19/2021, by Ultrasound presenting for routine prenatal visit  Plan   pregnancy2 Problems (from 01/02/21 to present)    Problem Noted Resolved   Marginal placenta previa  06/19/2021 by 06/21/2021, CNM No   Overview Signed 06/19/2021  8:34 AM by 06/21/2021, CNM    6/24- noted on anatomy scan at 26 weeks. Will rescan at 32 wks.      Supervision of normal pregnancy 03/31/2021 by 05/31/2021, CNM No   Overview Addendum 07/10/2021  6:29 PM by 07/12/2021, CNM    Clinic [facility] Pren[facility] Labs  Dating LMP Blood type:   O+  Genetic Screen Done at 26 weeks- marginal previa- needs rescan Antibody: neg  Anatomic [name] Normal female- marginal placental previa-1.7cms Rubella:   immune Varicella: Imm  GTT Early: NA                Third trimester: 131 RPR:   NR  Rhogam n/a HBsAg:   negative  Vaccines TDAP:                       Flu Shot: Covid: HIV:     Baby Food Breast                               GBS:   GC/CT:neg  Contraception  Pap: ASCUS/neg HR HPV 2021 Needs repeat PAP postpartum  CBB  no   CS/VBAC NA   Support Person fiance              Preterm labor symptoms and general obstetric precautions including but not limited to vaginal bleeding, contractions, leaking of fluid and fetal movement were reviewed in detail with the patient. Please refer to After Visit Summary for other counseling recommendations.   Return in about 1 week (around 07/28/2021)  for return OB.  [name], CNM  07/21/2021 3:33 PM

## 2021-08-01 ENCOUNTER — Ambulatory Visit
Admission: RE | Admit: 2021-08-01 | Discharge: 2021-08-01 | Disposition: A | Discharge status: Home / Self Care | Payer: No Typology Code available for payment source | Source: Ambulatory Visit | Attending: Obstetrics | Admitting: Obstetrics

## 2021-08-01 ENCOUNTER — Other Ambulatory Visit: Payer: Self-pay

## 2021-08-01 DIAGNOSIS — O4420 Partial placenta previa NOS or without hemorrhage, unspecified trimester: Secondary | ICD-10-CM | POA: Diagnosis present

## 2021-08-01 DIAGNOSIS — Z3482 Encounter for supervision of other normal pregnancy, second trimester: Secondary | ICD-10-CM | POA: Diagnosis not present

## 2021-08-03 ENCOUNTER — Encounter: Payer: No Typology Code available for payment source | Admitting: Obstetrics & Gynecology

## 2021-08-04 ENCOUNTER — Encounter: Payer: Self-pay | Admitting: Obstetrics & Gynecology

## 2021-08-04 ENCOUNTER — Ambulatory Visit (INDEPENDENT_AMBULATORY_CARE_PROVIDER_SITE_OTHER): Payer: No Typology Code available for payment source | Admitting: Obstetrics & Gynecology

## 2021-08-04 ENCOUNTER — Other Ambulatory Visit: Payer: Self-pay

## 2021-08-04 VITALS — BP 100/70 | Wt 178.0 lb

## 2021-08-04 DIAGNOSIS — Z3A33 33 weeks gestation of pregnancy: Secondary | ICD-10-CM

## 2021-08-04 DIAGNOSIS — Z3483 Encounter for supervision of other normal pregnancy, third trimester: Secondary | ICD-10-CM

## 2021-08-04 LAB — POCT URINALYSIS DIPSTICK OB
Glucose, UA: NEGATIVE
POC,PROTEIN,UA: NEGATIVE

## 2021-08-04 NOTE — Addendum Note (Signed)
Addended by: Cornelius Moras D on: 08/04/2021 10:24 AM   Modules accepted: Orders

## 2021-08-04 NOTE — Progress Notes (Signed)
  Subjective  Fetal Movement? yes Contractions? no Leaking Fluid? no Vaginal Bleeding? no  Objective  BP 100/70   Wt 178 lb (80.7 kg)   LMP 01/02/2021 (Within Days)   BMI 27.88 kg/m  General: NAD Pumonary: no increased work of breathing Abdomen: gravid, non-tender Extremities: no edema Psychiatric: mood appropriate, affect full  Assessment  30 y.o. G2P1001 at [redacted]w[redacted]d by  09/19/2021, by Ultrasound presenting for routine prenatal visit  Plan   Problem List Items Addressed This Visit      Other   Supervision of normal pregnancy - Primary  Other Visit Diagnoses    [redacted] weeks gestation of pregnancy        PNV, FMC, PTL precautions [place] discussed.  EFW 86%    Monitor for changes in growth  pregnancy2 Problems (from 01/02/21 to present)    Problem Noted Resolved   Marginal placenta previa 06/19/2021 by [name], CNM No   Overview Signed 06/19/2021  8:34 AM by [name], CNM    6/24- noted on anatomy scan at 26 weeks. Will rescan at 32 wks.      Supervision of normal pregnancy 03/31/2021 by Tresea Mall, CNM No   Overview Addendum 07/10/2021  6:29 PM by [name], CNM    Clinic [facility] Prenatal Labs  Dating LMP Blood type:   O+  Genetic Screen Done at 26 weeks- marginal previa- needs rescan Antibody: neg  Anatomic [place] Normal female- marginal placental previa-1.7cms Rubella:   immune Varicella: Imm  GTT Early: NA                Third trimester: 131 RPR:   NR  Rhogam n/a HBsAg:   negative  Vaccines TDAP:                       Flu Shot: Covid: HIV:     Baby Food Breast                               GBS:   GC/CT:neg  Contraception  Pap: ASCUS/neg HR HPV 2021 Needs repeat PAP postpartum  CBB  no   CS/VBAC NA   Support Person fiance              Annamarie Major, MD, [facility], [facility] 08/04/2021  10:16 AM

## 2021-08-04 NOTE — Patient Instructions (Signed)
Thank you for choosing [facility]. As part of our ongoing efforts to improve patient experience, we would appreciate your feedback. Please fill out the short survey that you will receive by mail or MyChart. Your opinion is important to us! -Dr Zyree Traynham  Third Trimester of Pregnancy  The third trimester of pregnancy is from week 28 through week 40. This is months 7 through 9. The third trimester is a time when the unborn baby (fetus) is growing rapidly. At the end of the ninth month, the fetus is about 20inches long and weighs 6-10 pounds. Body changes during your third trimester During the third trimester, your body will continue to go through many changes.The changes vary and generally return to normal after your baby is born. Physical changes Your weight will continue to increase. You can expect to gain 25-35 pounds (11-16 kg) by the end of the pregnancy if you begin pregnancy at a normal weight. If you are underweight, you can expect to gain 28-40 lb (about 13-18 kg), and if you are overweight, you can expect to gain 15-25 lb (about 7-11 kg). You may begin to get stretch marks on your hips, abdomen, and breasts. Your breasts will continue to grow and may hurt. A yellow fluid (colostrum) may leak from your breasts. This is the first milk you are producing for your baby. You may have changes in your hair. These can include thickening of your hair, rapid growth, and changes in texture. Some people also have hair loss during or after pregnancy, or hair that feels dry or thin. Your belly button may stick out. You may notice more swelling in your hands, face, or ankles. Health changes You may have heartburn. You may have constipation. You may develop hemorrhoids. You may develop swollen, bulging veins (varicose veins) in your legs. You may have increased body aches in the pelvis, back, or thighs. This is due to weight gain and increased hormones that are relaxing your joints. You may have  increased tingling or numbness in your hands, arms, and legs. The skin on your abdomen may also feel numb. You may feel short of breath because of your expanding uterus. Other changes You may urinate more often because the fetus is moving lower into your pelvis and pressing on your bladder. You may have more problems sleeping. This may be caused by the size of your abdomen, an increased need to urinate, and an increase in your body's metabolism. You may notice the fetus "dropping," or moving lower in your abdomen (lightening). You may have increased vaginal discharge. You may notice that you have pain around your pelvic bone as your uterus distends. Follow these instructions at home: Medicines Follow your health care provider's instructions regarding medicine use. Specific medicines may be either safe or unsafe to take during pregnancy. Do not take any medicines unless approved by your health care provider. Take a prenatal vitamin that contains at least 600 micrograms (mcg) of folic acid. Eating and drinking Eat a healthy diet that includes fresh fruits and vegetables, whole grains, good sources of protein such as meat, eggs, or tofu, and low-fat dairy products. Avoid raw meat and unpasteurized juice, milk, and cheese. These carry germs that can harm you and your baby. Eat 4 or 5 small meals rather than 3 large meals a day. You may need to take these actions to prevent or treat constipation: Drink enough fluid to keep your urine pale yellow. Eat foods that are high in fiber, such as beans, whole grains,   and fresh fruits and vegetables. Limit foods that are high in fat and processed sugars, such as fried or sweet foods. Activity Exercise only as directed by your health care provider. Most people can continue their usual exercise routine during pregnancy. Try to exercise for 30 minutes at least 5 days a week. Stop exercising if you experience contractions in the uterus. Stop exercising if you  develop pain or cramping in the lower abdomen or lower back. Avoid heavy lifting. Do not exercise if it is very hot or humid or if you are at a high altitude. If you choose to, you may continue to have sex unless your health care provider tells you not to. Relieving pain and discomfort Take frequent breaks and rest with your legs raised (elevated) if you have leg cramps or low back pain. Take warm sitz baths to soothe any pain or discomfort caused by hemorrhoids. Use hemorrhoid cream if your health care provider approves. Wear a supportive bra to prevent discomfort from breast tenderness. If you develop varicose veins: Wear support hose as told by your health care provider. Elevate your feet for 15 minutes, 3-4 times a day. Limit salt in your diet. Safety Talk to your health care provider before traveling far distances. Do not use hot tubs, steam rooms, or saunas. Wear your seat belt at all times when driving or riding in a car. Talk with your health care provider if someone is verbally or physically abusive to you. Preparing for birth To prepare for the arrival of your baby: Take prenatal classes to understand, practice, and ask questions about labor and delivery. Visit the hospital and tour the maternity area. Purchase a rear-facing car seat and make sure you know how to install it in your car. Prepare the baby's room or sleeping area. Make sure to remove all pillows and stuffed animals from the baby's crib to prevent suffocation. General instructions Avoid cat litter boxes and soil used by cats. These carry germs that can cause birth defects in the baby. If you have a cat, ask someone to clean the litter box for you. Do not douche or use tampons. Do not use scented sanitary pads. Do not use any products that contain nicotine or tobacco, such as cigarettes, e-cigarettes, and chewing tobacco. If you need help quitting, ask your health care provider. Do not use any herbal remedies, illegal  drugs, or medicines that were not prescribed to you. Chemicals in these products can harm your baby. Do not drink alcohol. You will have more frequent prenatal exams during the third trimester. During a routine prenatal visit, your health care provider will do a physical exam, perform tests, and discuss your overall health. Keep all follow-up visits. This is important. Where to find more information American Pregnancy Association: americanpregnancy.org American College of Obstetricians and Gynecologists: acog.org/en/Womens%20Health/Pregnancy Office on Women's Health: womenshealth.gov/pregnancy Contact a health care provider if you have: A fever. Mild pelvic cramps, pelvic pressure, or nagging pain in your abdominal area or lower back. Vomiting or diarrhea. Bad-smelling vaginal discharge or foul-smelling urine. Pain when you urinate. A headache that does not go away when you take medicine. Visual changes or see spots in front of your eyes. Get help right away if: Your water breaks. You have regular contractions less than 5 minutes apart. You have spotting or bleeding from your vagina. You have severe abdominal pain. You have difficulty breathing. You have chest pain. You have fainting spells. You have not felt your baby move for the time period   told by your health care provider. You have new or increased pain, swelling, or redness in an arm or leg. Summary The third trimester of pregnancy is from week 28 through week 40 (months 7 through 9). You may have more problems sleeping. This can be caused by the size of your abdomen, an increased need to urinate, and an increase in your body's metabolism. You will have more frequent prenatal exams during the third trimester. Keep all follow-up visits. This is important. This information is not intended to replace advice given to you by your health care provider. Make sure you discuss any questions you have with your healthcare provider. Document  Revised: 05/18/2020 Document Reviewed: 03/24/2020 Elsevier Patient Education  2022 [employer]vier Inc.  

## 2021-08-09 ENCOUNTER — Telehealth: Payer: Self-pay

## 2021-08-09 NOTE — Telephone Encounter (Signed)
Pt aware.

## 2021-08-09 NOTE — Telephone Encounter (Signed)
LMTC

## 2021-08-09 NOTE — Telephone Encounter (Signed)
Pt calling; last night had vomiting for two hours; this morning has really bad swelling in lips, eyes, cheeks; swelling has gone down some; appt?  [phone]  Pt states she feels a lot better now; eyes are still a little bit puffy; she thinks she ate some salmon that didn't sit well with her; adv that would explain the vomitting; not sure about the facial swelling; will ask PH and get back with her.

## 2021-08-09 NOTE — Telephone Encounter (Signed)
Advise, likely food poisoning/reaction and will recover now.  Hydrate well.  Benadryl.

## 2021-08-14 ENCOUNTER — Telehealth: Payer: Self-pay

## 2021-08-14 NOTE — Telephone Encounter (Signed)
Pt calling; went to Urgent Care today; dx'd c URI; was rx'd amoxicillin; is it okay to take during pregnancy?  [phone]  Adv okay to take as long as she is not allergic to it.

## 2021-08-15 ENCOUNTER — Ambulatory Visit (INDEPENDENT_AMBULATORY_CARE_PROVIDER_SITE_OTHER): Payer: No Typology Code available for payment source | Admitting: Advanced Practice Midwife

## 2021-08-15 ENCOUNTER — Other Ambulatory Visit: Payer: Self-pay

## 2021-08-15 ENCOUNTER — Encounter: Payer: Self-pay | Admitting: Advanced Practice Midwife

## 2021-08-15 VITALS — BP 120/70 | Ht 67.0 in | Wt 172.0 lb

## 2021-08-15 DIAGNOSIS — Z3483 Encounter for supervision of other normal pregnancy, third trimester: Secondary | ICD-10-CM

## 2021-08-15 DIAGNOSIS — Z3A35 35 weeks gestation of pregnancy: Secondary | ICD-10-CM

## 2021-08-15 LAB — POCT URINALYSIS DIPSTICK OB
Glucose, UA: NEGATIVE
POC,PROTEIN,UA: NEGATIVE

## 2021-08-15 NOTE — Progress Notes (Signed)
Routine Prenatal Care Visit  Subjective  Michelle Clarke is a 30 y.o. G2P1001 at [redacted]w[redacted]d being seen today for ongoing prenatal care.  She is currently monitored for the following issues for this low-risk pregnancy and has Supervision of normal pregnancy and Marginal placenta previa on their problem list.  ----------------------------------------------------------------------------------- Patient reports upper respiratory symptoms since last Thursday. She has cough, sore throat, congestion. She denies fever or body aches. When she coughs it causes her to feel nauseated and have vomiting. She took a home Covid test which was negative. She went to urgent care yesterday. They started her on amoxicillin for URI and they gave her phenergan for the nausea which didn't help. We reviewed safe medications, treat symptoms, increase hydration, extra rest.    Contractions: Not present. Vag. Bleeding: None.  Movement: Present. Leaking Fluid denies.  ----------------------------------------------------------------------------------- The following portions of the patient's history were reviewed and updated as appropriate: allergies, current medications, past family history, past medical history, past social history, past surgical history and problem list. Problem list updated.  Objective  Blood pressure 120/70, height 5\' 7"  (1.702 m), weight 172 lb (78 kg), last menstrual period 01/02/2021. Pregravid weight 160 lb (72.6 kg) Total Weight Gain 12 lb (5.443 kg) Urinalysis: Urine Protein    Urine Glucose    Fetal Status: Fetal Heart Rate (bpm): 144 Fundal Height: 35 cm Movement: Present     General:  Alert, oriented and cooperative. Patient is in no acute distress.  Skin: Skin is warm and dry. No rash noted.   Cardiovascular: Normal heart rate noted  Respiratory: Normal respiratory effort, no problems with respiration noted  Abdomen: Soft, gravid, appropriate for gestational age. Pain/Pressure: Absent     Pelvic:   Cervical exam deferred        Extremities: Normal range of motion.  Edema: None  Mental Status: Normal mood and affect. Normal behavior. Normal judgment and thought content.   Assessment   30 y.o. G2P1001 at [redacted]w[redacted]d by  09/19/2021, by Ultrasound presenting for work-in prenatal visit  Plan   pregnancy2 Problems (from 01/02/21 to present)    Problem Noted Resolved   Marginal placenta previa 06/19/2021 by 06/21/2021, CNM No   Overview Signed 06/19/2021  8:34 AM by 06/21/2021, CNM    6/24- noted on anatomy scan at 26 weeks. Will rescan at 32 wks.      Supervision of normal pregnancy 03/31/2021 by 05/31/2021, CNM No   Overview Addendum 07/10/2021  6:29 PM by 07/12/2021, CNM    Clinic [facility] Prenatal Labs  Dating LMP Blood type:   O+  Genetic Screen Done at 26 weeks- marginal previa- needs rescan Antibody: neg  Anatomic Michelle Clarke Normal female- marginal placental previa-1.7cms Rubella:   immune Varicella: Imm  GTT Early: NA                Third trimester: 131 RPR:   NR  Rhogam n/a HBsAg:   negative  Vaccines TDAP:                       Flu Shot: Covid: HIV:     Baby Food Breast                               GBS:   GC/CT:neg  Contraception  Pap: ASCUS/neg HR HPV 2021 Needs repeat PAP postpartum  CBB  no   CS/VBAC NA   Support  Person fiance              Preterm labor symptoms and general obstetric precautions including but not limited to vaginal bleeding, contractions, leaking of fluid and fetal movement were reviewed in detail with the patient.   Return for scheduled visit next week.  Michelle Clarke, CNM 08/15/2021 2:18 PM

## 2021-08-18 ENCOUNTER — Encounter: Payer: No Typology Code available for payment source | Admitting: Obstetrics

## 2021-08-21 ENCOUNTER — Other Ambulatory Visit: Payer: Self-pay | Admitting: Obstetrics & Gynecology

## 2021-08-24 ENCOUNTER — Ambulatory Visit (INDEPENDENT_AMBULATORY_CARE_PROVIDER_SITE_OTHER): Payer: No Typology Code available for payment source | Admitting: Obstetrics and Gynecology

## 2021-08-24 ENCOUNTER — Other Ambulatory Visit (HOSPITAL_COMMUNITY)
Admission: RE | Admit: 2021-08-24 | Discharge: 2021-08-24 | Disposition: A | Discharge status: Home / Self Care | Payer: No Typology Code available for payment source | Source: Ambulatory Visit | Attending: Obstetrics and Gynecology | Admitting: Obstetrics and Gynecology

## 2021-08-24 ENCOUNTER — Encounter: Payer: Self-pay | Admitting: Obstetrics and Gynecology

## 2021-08-24 ENCOUNTER — Encounter: Payer: No Typology Code available for payment source | Admitting: Obstetrics

## 2021-08-24 ENCOUNTER — Other Ambulatory Visit: Payer: Self-pay

## 2021-08-24 VITALS — BP 120/68 | Ht 67.0 in | Wt 182.4 lb

## 2021-08-24 DIAGNOSIS — O26843 Uterine size-date discrepancy, third trimester: Secondary | ICD-10-CM

## 2021-08-24 DIAGNOSIS — Z3A36 36 weeks gestation of pregnancy: Secondary | ICD-10-CM

## 2021-08-24 DIAGNOSIS — Z3483 Encounter for supervision of other normal pregnancy, third trimester: Secondary | ICD-10-CM

## 2021-08-24 LAB — POCT URINALYSIS DIPSTICK OB
Glucose, UA: NEGATIVE
POC,PROTEIN,UA: NEGATIVE

## 2021-08-24 LAB — OB RESULTS CONSOLE GC/CHLAMYDIA: Gonorrhea: NEGATIVE

## 2021-08-24 NOTE — Progress Notes (Signed)
Routine Prenatal Care Visit  Subjective  Michelle Clarke is a 30 y.o. G2P1001 at [redacted]w[redacted]d being seen today for ongoing prenatal care.  She is currently monitored for the following issues for this low-risk pregnancy and has Supervision of normal pregnancy and Marginal placenta previa on their problem list.  ----------------------------------------------------------------------------------- Patient reports no complaints.   Contractions: Not present. Vag. Bleeding: None.  Movement: Present. Denies leaking of fluid.  ----------------------------------------------------------------------------------- The following portions of the patient's history were reviewed and updated as appropriate: allergies, current medications, past family history, past medical history, past social history, past surgical history and problem list. Problem list updated.   Objective  Blood pressure 120/68, height 5\' 7"  (1.702 m), weight 182 lb 6.4 oz (82.7 kg), last menstrual period 01/02/2021. Pregravid weight 160 lb (72.6 kg) Total Weight Gain 22 lb 6.4 oz (10.2 kg) Urinalysis:      Fetal Status: Fetal Heart Rate (bpm): 145 Fundal Height: 30 cm Movement: Present     General:  Alert, oriented and cooperative. Patient is in no acute distress.  Skin: Skin is warm and dry. No rash noted.   Cardiovascular: Normal heart rate noted  Respiratory: Normal respiratory effort, no problems with respiration noted  Abdomen: Soft, gravid, appropriate for gestational age. Pain/Pressure: Absent     Pelvic:  Cervical exam performed Dilation: Closed Effacement (%): 0 Station: -3  Extremities: Normal range of motion.  Edema: None  Mental Status: Normal mood and affect. Normal behavior. Normal judgment and thought content.     Assessment   30 y.o. G2P1001 at [redacted]w[redacted]d by  09/19/2021, by Ultrasound presenting for routine prenatal visit  Plan   pregnancy2 Problems (from 01/02/21 to present)     Problem Noted Resolved   Marginal  placenta previa 06/19/2021 by 06/21/2021, CNM No   Overview Signed 06/19/2021  8:34 AM by 06/21/2021, CNM    6/24- noted on anatomy scan at 26 weeks. Will rescan at 32 wks.      Supervision of normal pregnancy 03/31/2021 by 05/31/2021, CNM No   Overview Addendum 08/24/2021  1:57 PM by 10/24/2021, MD     Nursing Staff Provider  Office Location  [facility] Dating    Language  English Anatomy Natale Milch  Complete Marginal placenta, resolved  Flu Vaccine   Genetic Screen  NIPS:   TDaP vaccine   07/21/2021 Hgb A1C or  GTT Third trimester : 131  Covid Unvaccinated covid 11/2020   LAB RESULTS   Rhogam  Not needed Blood Type O/Positive/-- (04/08 0917)   Feeding Plan breast Antibody Negative (04/08 0917)  Contraception undecided Rubella 1.57 (04/08 0917)  Circumcision  RPR Non Reactive (07/15 0934)   Pediatrician  [facility] HBsAg Negative (04/08 0917)   Support Person Fiance- Noe HIV Non Reactive (07/15 0934)  Prenatal Classes discussed Varicella immune    GBS    BTL Consent n/a    VBAC Consent n/a Pap  ASCUS/neg HR HPV 2021 Needs repeat PAP postpartum    Hgb Electro  normal    CF      SMA                 GBS/ GC CT today Discussed available prenatal classes Reviewed options for birth control after pregnancy Growth 2022 ordered, uterine size date discrepancy    Gestational age appropriate obstetric precautions including but not limited to vaginal bleeding, contractions, leaking of fluid and fetal movement were reviewed in detail with the patient.  Return in about 1 week (around 08/31/2021) for ROB weekly for 4 visits.  Natale Milch MD [facility], [facility] 08/24/2021, 2:00 PM

## 2021-08-24 NOTE — Patient Instructions (Signed)

## 2021-08-25 ENCOUNTER — Encounter: Payer: No Typology Code available for payment source | Admitting: Advanced Practice Midwife

## 2021-08-28 LAB — CULTURE, BETA STREP (GROUP B ONLY): Strep Gp B Culture: NEGATIVE

## 2021-08-29 LAB — CERVICOVAGINAL ANCILLARY ONLY
Chlamydia: NEGATIVE
Comment: NEGATIVE
Comment: NEGATIVE
Comment: NORMAL
Neisseria Gonorrhea: NEGATIVE
Trichomonas: NEGATIVE

## 2021-09-01 ENCOUNTER — Ambulatory Visit (INDEPENDENT_AMBULATORY_CARE_PROVIDER_SITE_OTHER): Payer: No Typology Code available for payment source | Admitting: Obstetrics and Gynecology

## 2021-09-01 ENCOUNTER — Other Ambulatory Visit: Payer: Self-pay

## 2021-09-01 ENCOUNTER — Encounter: Payer: Self-pay | Admitting: Obstetrics and Gynecology

## 2021-09-01 DIAGNOSIS — Z3483 Encounter for supervision of other normal pregnancy, third trimester: Secondary | ICD-10-CM

## 2021-09-01 DIAGNOSIS — O4420 Partial placenta previa NOS or without hemorrhage, unspecified trimester: Secondary | ICD-10-CM

## 2021-09-01 NOTE — Patient Instructions (Signed)
Third Trimester of Pregnancy °The third trimester of pregnancy is from week 28 through week 40. This is months 7 through 9. The third trimester is a time when the unborn baby (fetus) is growing rapidly. At the end of the ninth month, the fetus is about 20 inches long and weighs 6-10 pounds. °Body changes during your third trimester °During the third trimester, your body will continue to go through many changes. The changes vary and generally return to normal after your baby is born. °Physical changes °Your weight will continue to increase. You can expect to gain 25-35 pounds (11-16 kg) by the end of the pregnancy if you begin pregnancy at a normal weight. If you are underweight, you can expect to gain 28-40 lb (about 13-18 kg), and if you are overweight, you can expect to gain 15-25 lb (about 7-11 kg). °You may begin to get stretch marks on your hips, abdomen, and breasts. °Your breasts will continue to grow and may hurt. A yellow fluid (colostrum) may leak from your breasts. This is the first milk you are producing for your baby. °You may have changes in your hair. These can include thickening of your hair, rapid growth, and changes in texture. Some people also have hair loss during or after pregnancy, or hair that feels dry or thin. °Your belly button may stick out. °You may notice more swelling in your hands, face, or ankles. °Health changes °You may have heartburn. °You may have constipation. °You may develop hemorrhoids. °You may develop swollen, bulging veins (varicose veins) in your legs. °You may have increased body aches in the pelvis, back, or thighs. This is due to weight gain and increased hormones that are relaxing your joints. °You may have increased tingling or numbness in your hands, arms, and legs. The skin on your abdomen may also feel numb. °You may feel short of breath because of your expanding uterus. °Other changes °You may urinate more often because the fetus is moving lower into your pelvis  and pressing on your bladder. °You may have more problems sleeping. This may be caused by the size of your abdomen, an increased need to urinate, and an increase in your body's metabolism. °You may notice the fetus "dropping," or moving lower in your abdomen (lightening). °You may have increased vaginal discharge. °You may notice that you have pain around your pelvic bone as your uterus distends. °Follow these instructions at home: °Medicines °Follow your health care provider's instructions regarding medicine use. Specific medicines may be either safe or unsafe to take during pregnancy. Do not take any medicines unless approved by your health care provider. °Take a prenatal vitamin that contains at least 600 micrograms (mcg) of folic acid. °Eating and drinking °Eat a healthy diet that includes fresh fruits and vegetables, whole grains, good sources of protein such as meat, eggs, or tofu, and low-fat dairy products. °Avoid raw meat and unpasteurized juice, milk, and cheese. These carry germs that can harm you and your baby. °Eat 4 or 5 small meals rather than 3 large meals a day. °You may need to take these actions to prevent or treat constipation: °Drink enough fluid to keep your urine pale yellow. °Eat foods that are high in fiber, such as beans, whole grains, and fresh fruits and vegetables. °Limit foods that are high in fat and processed sugars, such as fried or sweet foods. °Activity °Exercise only as directed by your health care provider. Most people can continue their usual exercise routine during pregnancy. Try to   exercise for 30 minutes at least 5 days a week. Stop exercising if you experience contractions in the uterus. °Stop exercising if you develop pain or cramping in the lower abdomen or lower back. °Avoid heavy lifting. °Do not exercise if it is very hot or humid or if you are at a high altitude. °If you choose to, you may continue to have sex unless your health care provider tells you not  to. °Relieving pain and discomfort °Take frequent breaks and rest with your legs raised (elevated) if you have leg cramps or low back pain. °Take warm sitz baths to soothe any pain or discomfort caused by hemorrhoids. Use hemorrhoid cream if your health care provider approves. °Wear a supportive bra to prevent discomfort from breast tenderness. °If you develop varicose veins: °Wear support hose as told by your health care provider. °Elevate your feet for 15 minutes, 3-4 times a day. °Limit salt in your diet. °Safety °Talk to your health care provider before traveling far distances. °Do not use hot tubs, steam rooms, or saunas. °Wear your seat belt at all times when driving or riding in a car. °Talk with your health care provider if someone is verbally or physically abusive to you. °Preparing for birth °To prepare for the arrival of your baby: °Take prenatal classes to understand, practice, and ask questions about labor and delivery. °Visit the hospital and tour the maternity area. °Purchase a rear-facing car seat and make sure you know how to install it in your car. °Prepare the baby's room or sleeping area. Make sure to remove all pillows and stuffed animals from the baby's crib to prevent suffocation. °General instructions °Avoid cat litter boxes and soil used by cats. These carry germs that can cause birth defects in the baby. If you have a cat, ask someone to clean the litter box for you. °Do not douche or use tampons. Do not use scented sanitary pads. °Do not use any products that contain nicotine or tobacco, such as cigarettes, e-cigarettes, and chewing tobacco. If you need help quitting, ask your health care provider. °Do not use any herbal remedies, illegal drugs, or medicines that were not prescribed to you. Chemicals in these products can harm your baby. °Do not drink alcohol. °You will have more frequent prenatal exams during the third trimester. During a routine prenatal visit, your health care provider  will do a physical exam, perform tests, and discuss your overall health. Keep all follow-up visits. This is important. °Where to find more information °American Pregnancy Association: americanpregnancy.org °American College of Obstetricians and Gynecologists: acog.org/en/Womens%20Health/Pregnancy °Office on Women's Health: womenshealth.gov/pregnancy °Contact a health care provider if you have: °A fever. °Mild pelvic cramps, pelvic pressure, or nagging pain in your abdominal area or lower back. °Vomiting or diarrhea. °Bad-smelling vaginal discharge or foul-smelling urine. °Pain when you urinate. °A headache that does not go away when you take medicine. °Visual changes or see spots in front of your eyes. °Get help right away if: °Your water breaks. °You have regular contractions less than 5 minutes apart. °You have spotting or bleeding from your vagina. °You have severe abdominal pain. °You have difficulty breathing. °You have chest pain. °You have fainting spells. °You have not felt your baby move for the time period told by your health care provider. °You have new or increased pain, swelling, or redness in an arm or leg. °Summary °The third trimester of pregnancy is from week 28 through week 40 (months 7 through 9). °You may have more problems sleeping.   This can be caused by the size of your abdomen, an increased need to urinate, and an increase in your body's metabolism. °You will have more frequent prenatal exams during the third trimester. Keep all follow-up visits. This is important. °This information is not intended to replace advice given to you by your health care provider. Make sure you discuss any questions you have with your health care provider. °Document Revised: 05/18/2020 Document Reviewed: 03/24/2020 °Elsevier Patient Education © 2022 [employer] °Vaginal Delivery °Vaginal delivery means that you give birth by pushing your baby out of your birth canal (vagina). Your health care team will help you  before, during, and after vaginal delivery. °Birth experiences are unique for every woman and every pregnancy, and birth experiences vary depending on where you choose to give birth. °What are the risks and benefits? °Generally, this is safe. However, problems may occur, including: °Bleeding. °Infection. °Damage to other structures such as vaginal tearing. °Allergic reactions to medicines. °Despite the risks, benefits of vaginal delivery include less risk of bleeding and infection and a shorter recovery time compared to a Cesarean delivery. Cesarean delivery, or C-section, is the surgical delivery of a baby. °What happens when I arrive at the birth center or hospital? °Once you are in labor and have been admitted into the hospital or birth center, your health care team may: °Review your pregnancy history and any concerns that you have. °Talk with you about your birth plan and discuss pain control options. °Check your blood pressure, breathing, and heartbeat. °Assess your baby's heartbeat. °Monitor your uterus for contractions. °Check whether your bag of water (amniotic sac) has broken (ruptured). °Insert an IV into one of your veins. This may be used to give you fluids and medicines. °Monitoring °Your health care team may assess your contractions (uterine monitoring) and your baby's heart rate (fetal monitoring). You may need to be monitored: °Often, but not continuously (intermittently). °All the time or for long periods at a time (continuously). Continuous monitoring may be needed if: °You are taking certain medicines, such as medicine to relieve pain or make your contractions stronger. °You have pregnancy or labor complications. °Monitoring may be done by: °Placing a special stethoscope or a handheld monitoring device on your abdomen to check your baby's heartbeat and to check for contractions. °Placing monitors on your abdomen (external monitors) to record your baby's heartbeat and the frequency and length of  contractions. °Placing monitors inside your uterus through your vagina (internal monitors) to record your baby's heartbeat and the frequency, length, and strength of your contractions. Depending on the type of monitor, it may remain in your uterus or on your baby's head until birth. °Telemetry. This is a type of continuous monitoring that can be done with external or internal monitors. Instead of having to stay in bed, you are able to move around. °Physical exam °Your health care team may perform frequent physical exams. This may include: °Checking how and where your baby is positioned in your uterus. °Checking your cervix to determine: °Whether it is thinning out (effacing). °Whether it is opening up (dilating). °What happens during labor and delivery? °Normal labor and delivery is divided into the following three stages: °Stage 1 °This is the longest stage of labor. °Throughout this stage, you will feel contractions. Contractions generally feel mild, infrequent, and irregular at first. They get stronger, more frequent, and more regular as you move through this stage. You may have contractions about every 2-3 minutes. °This stage ends when your cervix is   completely dilated to 4 inches (10 cm) and completely effaced. °Stage 2 °This stage starts once your cervix is completely effaced and dilated and lasts until the delivery of your baby. °This is the stage where you will feel an urge to push your baby out of your vagina. °You may feel stretching and burning pain, especially when the widest part of your baby's head passes through the vaginal opening (crowning). °Once your baby is delivered, the umbilical cord will be clamped and cut. Timing of cutting the cord will depend on your wishes, your baby's health, and your health care provider's practices. °Your baby will be placed on your bare chest (skin-to-skin contact) in an upright position and covered with a warm blanket. If you are choosing to breastfeed, watch your  baby for feeding cues, like rooting or sucking, and help the baby to your breast for his or her first feeding. °Stage 3 °This stage starts immediately after the birth of your baby and ends after you deliver the placenta. °This stage may take anywhere from 5 to 30 minutes. °After your baby has been delivered, you will feel contractions as your body expels the placenta. These contractions also help your uterus get smaller and reduce bleeding. °What can I expect after labor and delivery? °After labor is over, you and your baby will be assessed closely until you are ready to go home. Your health care team will teach you how to care for yourself and your baby. °You and your baby may be encouraged to stay in the same room (rooming in) during your hospital stay. This will help promote early bonding and successful breastfeeding. °Your uterus will be checked and massaged regularly (fundal massage). °You may continue to receive fluids and medicines through an IV. °You will have some soreness and pain in your abdomen, vagina, and the area of skin between your vaginal opening and your anus (perineum). °If an incision was made near your vagina (episiotomy) or if you had some vaginal tearing during delivery, cold compresses may be placed on your episiotomy or your tear. This helps to reduce pain and swelling. °It is normal to have vaginal bleeding after delivery. Wear a sanitary pad for vaginal bleeding and discharge. °Summary °Vaginal delivery means that you will give birth by pushing your baby out of your birth canal (vagina). °Your health care team will monitor you and your baby throughout the stages of labor. °After you deliver your baby, your health care team will continue to assess you and your baby to ensure you are both recovering as expected after delivery. °This information is not intended to replace advice given to you by your health care provider. Make sure you discuss any questions you have with your health care  provider. °Document Revised: 11/07/2020 Document Reviewed: 11/07/2020 °Elsevier Patient Education © 2022 [employer] °Pain Relief During Labor and Delivery °Many things can cause pain during labor and delivery, including: °Pressure due to the baby moving through the pelvis. °Stretching of tissues due to the baby moving through the birth canal. °Muscle tension due to anxiety or nervousness. °The uterus tightening (contracting)and relaxing to help move the baby. °How do I get pain relief during labor and delivery? °Discuss your pain relief options with your health care provider during your prenatal visits. Explore the options offered by your hospital or birth center. There are many ways to deal with the pain of labor and delivery. You can try relaxation techniques or doing relaxing activities, taking a warm shower or bath (hydrotherapy),   or other methods. There are also many medicines available to help control pain. °Relaxation techniques and activities °Practice relaxation techniques or do relaxing activities, such as: °Focused breathing. °Meditation. °Visualization. °Aroma therapy. °Listening to your favorite music. °Hypnosis. °Hydrotherapy °Take a warm shower or bath. This may: °Provide comfort and relaxation. °Lessen your feeling of pain. °Reduce the amount of pain medicine needed. °Shorten the length of labor. °Other methods °Try doing other things, such as: °Getting a massage or having counterpressure on your back. °Applying warm packs or ice packs. °Changing positions often, moving around, or using a birthing ball. °Medicines °You may be given: °Pain medicine through an IV or an injection into a muscle. °Pain medicine inserted into your spinal column. °Injections of sterile water just under the skin on your lower back. °Nitrous oxide inhalation therapy, also called laughing gas. °What kinds of medicine are available for pain relief? °There are two kinds of medicines that can be used to relieve pain during  labor and delivery: °Analgesics. These medicines decrease pain without causing you to lose feeling or the ability to move your muscles. °Anesthetics. These medicines block feeling in the body and can decrease your ability to move freely. °Both kinds of medicine can cause minor side effects, such as nausea, trouble concentrating, and sleepiness. They can also affect the baby's heart rate before birth and his or her breathing after birth. For this reason, health care providers are careful about when and how much medicine is given. °Which medicines are used to provide pain relief? °Common medicines °The most common medicines used to help manage pain during labor and delivery include: °Opioids. Opioids are medicines that decrease how much pain you feel (perception of pain). These medicines can be given through an IV or may be used with anesthetics to block pain. °Epidural analgesia. °Epidural analgesia is given through a very thin tube that is inserted into the lower back. Medicine is delivered continuously to the area near your spinal column nerves (epidural space). After having this treatment, you may be able to move your legs, but you will not be able to walk. Depending on the amount and type of medicine given, you may lose all feeling in the lower half of your body, or you may have some sensation, including the urge to push. This treatment can be used to give pain relief for a vaginal birth. °Sometimes, a numbing medicine is injected into the spinal fluid when an epidural catheter is placed. This provides for immediate relief but only lasts for 1-2 hours. Once it wears off, the epidural will provide pain relief. This is called a combined spinal-epidural (CSE) block. °Intrathecal analgesia (spinal analgesia). Intrathecal analgesia is similar to epidural analgesia, but the medicine is injected into the spinal fluid instead of the epidural space. It is usually only given once. It starts to relieve pain quickly, but the  pain relief lasts only 1-2 hours. °Pudendal block. This block is done by injecting numbing medicine through the wall of the vagina and into a nerve in the pelvis. °Other medicines °Other medicines used to help manage pain during labor and delivery include: °Local anesthetics. These are used to numb a small area of the body. They may be used along with another kind of medicine or used to numb the nerves of the vagina, cervix, and perineum during the second stage of labor. °Spinal block (spinal anesthesia). Spinal anesthesia is similar to spinal analgesia, but the medicine that is used contains longer-acting numbing medicines and pain medicines. This   type of anesthesia can be used for a cesarean delivery and allows you to stay awake for the birth of your baby. °General anesthetics cause you to lose consciousness so you do not feel pain. They are usually only used for an emergency cesarean delivery. These medicines are given through an IV or a mask or both. °These medicines are used as part of a procedure or for an emergency delivery. °Summary °Women have many options to help them manage the pain associated with labor and delivery. °You can try doing relaxing activities, taking a warm shower or bath, or other methods. °There are also many medicines available to help control pain during labor and delivery. °Talk with your health care provider about what options are available to you. °This information is not intended to replace advice given to you by your health care provider. Make sure you discuss any questions you have with your health care provider. °Document Revised: 10/28/2019 Document Reviewed: 10/28/2019 °Elsevier Patient Education © 2022 [employer] Inc. ° °

## 2021-09-01 NOTE — Progress Notes (Signed)
Routine Prenatal Care Visit  Subjective  Michelle Clarke is a 30 y.o. G2P1001 at [redacted]w[redacted]d being seen today for ongoing prenatal care.  She is currently monitored for the following issues for this low-risk pregnancy and has Supervision of normal pregnancy and Marginal placenta previa on their problem list.  ----------------------------------------------------------------------------------- Patient reports no complaints.   Contractions: Not present. Vag. Bleeding: None.  Movement: Present. Denies leaking of fluid.  ----------------------------------------------------------------------------------- The following portions of the patient's history were reviewed and updated as appropriate: allergies, current medications, past family history, past medical history, past social history, past surgical history and problem list. Problem list updated.   Objective  Blood pressure 100/70, height 5\' 7"  (1.702 m), weight 185 lb 3.2 oz (84 kg), last menstrual period 01/02/2021. Pregravid weight 160 lb (72.6 kg) Total Weight Gain 25 lb 3.2 oz (11.4 kg) Urinalysis:      Fetal Status: Fetal Heart Rate (bpm): 140 Fundal Height: 34 cm Movement: Present     General:  Alert, oriented and cooperative. Patient is in no acute distress.  Skin: Skin is warm and dry. No rash noted.   Cardiovascular: Normal heart rate noted  Respiratory: Normal respiratory effort, no problems with respiration noted  Abdomen: Soft, gravid, appropriate for gestational age. Pain/Pressure: Absent     Pelvic:  Cervical exam deferred        Extremities: Normal range of motion.  Edema: None  Mental Status: Normal mood and affect. Normal behavior. Normal judgment and thought content.     Assessment   30 y.o. G2P1001 at [redacted]w[redacted]d by  09/19/2021, by Ultrasound presenting for routine prenatal visit  Plan   pregnancy2 Problems (from 01/02/21 to present)     Problem Noted Resolved   Marginal placenta previa 06/19/2021 by 06/21/2021, CNM  No   Overview Signed 06/19/2021  8:34 AM by 06/21/2021, CNM    6/24- noted on anatomy scan at 26 weeks. Will rescan at 32 wks. Resolved 8/10 7/24     Supervision of normal pregnancy 03/31/2021 by 05/31/2021, CNM No   Overview Addendum 09/01/2021  9:41 AM by 11/01/2021, MD     Nursing Staff Provider  Office Location  [facility]    Language  English Anatomy Natale Milch  Complete Marginal placenta, resolved  Flu Vaccine   Genetic Screen  NIPS: declined  TDaP vaccine   07/21/2021 Hgb A1C or  GTT Third trimester : 131  Covid Unvaccinated covid 11/2020   LAB RESULTS   Rhogam  Not needed Blood Type O/Positive/-- (04/08 0917)   Feeding Plan breast Antibody Negative (04/08 0917)  Contraception undecided Rubella 1.57 (04/08 0917)  Circumcision  RPR Non Reactive (07/15 0934)   Pediatrician  [facility] HBsAg Negative (04/08 0917)   Support Person Fiance- Noe HIV Non Reactive (07/15 0934)  Prenatal Classes discussed Varicella immune    GBS  negative  BTL Consent n/a    VBAC Consent n/a Pap  ASCUS/neg HR HPV 2021 Needs repeat PAP postpartum    Hgb Electro  normal    CF      SMA                   Gestational age appropriate obstetric precautions including but not limited to vaginal bleeding, contractions, leaking of fluid and fetal movement were reviewed in detail with the patient.    Return in about 1 week (around 09/08/2021) for ROB as planned.  09/10/2021 MD [facility], [facility] 09/01/2021, 9:43 AM

## 2021-09-04 ENCOUNTER — Other Ambulatory Visit: Payer: Self-pay | Admitting: Obstetrics & Gynecology

## 2021-09-05 ENCOUNTER — Ambulatory Visit
Admission: RE | Admit: 2021-09-05 | Discharge: 2021-09-05 | Disposition: A | Discharge status: Home / Self Care | Payer: No Typology Code available for payment source | Source: Ambulatory Visit | Attending: Obstetrics and Gynecology | Admitting: Obstetrics and Gynecology

## 2021-09-05 ENCOUNTER — Other Ambulatory Visit: Payer: Self-pay

## 2021-09-05 ENCOUNTER — Encounter: Payer: No Typology Code available for payment source | Admitting: Advanced Practice Midwife

## 2021-09-05 DIAGNOSIS — O26843 Uterine size-date discrepancy, third trimester: Secondary | ICD-10-CM | POA: Diagnosis present

## 2021-09-05 DIAGNOSIS — Z3483 Encounter for supervision of other normal pregnancy, third trimester: Secondary | ICD-10-CM | POA: Diagnosis not present

## 2021-09-06 ENCOUNTER — Ambulatory Visit (INDEPENDENT_AMBULATORY_CARE_PROVIDER_SITE_OTHER): Payer: No Typology Code available for payment source | Admitting: Obstetrics

## 2021-09-06 VITALS — BP 100/70 | Wt 185.0 lb

## 2021-09-06 DIAGNOSIS — Z23 Encounter for immunization: Secondary | ICD-10-CM | POA: Diagnosis not present

## 2021-09-06 DIAGNOSIS — Z3483 Encounter for supervision of other normal pregnancy, third trimester: Secondary | ICD-10-CM

## 2021-09-06 LAB — POCT URINALYSIS DIPSTICK OB
Glucose, UA: NEGATIVE
POC,PROTEIN,UA: NEGATIVE

## 2021-09-06 NOTE — Progress Notes (Signed)
  Routine Prenatal Care Visit  Subjective  Michelle Clarke is a 30 y.o. G2P1001 at [redacted]w[redacted]d being seen today for ongoing prenatal care.  She is currently monitored for the following issues for this low-risk pregnancy and has Supervision of normal pregnancy and Marginal placenta previa on their problem list.  ----------------------------------------------------------------------------------- Patient reports no complaints.   Contractions: Irregular. Vag. Bleeding: None.  Movement: Present. Leaking Fluid denies.  ----------------------------------------------------------------------------------- The following portions of the patient's history were reviewed and updated as appropriate: allergies, current medications, past family history, past medical history, past social history, past surgical history and problem list. Problem list updated.  Objective  Blood pressure 100/70, weight 185 lb (83.9 kg), last menstrual period 01/02/2021. Pregravid weight 160 lb (72.6 kg) Total Weight Gain 25 lb (11.3 kg) Urinalysis: Urine Protein Negative  Urine Glucose Negative  Fetal Status:     Movement: Present     General:  Alert, oriented and cooperative. Patient is in no acute distress.  Skin: Skin is warm and dry. No rash noted.   Cardiovascular: Normal heart rate noted  Respiratory: Normal respiratory effort, no problems with respiration noted  Abdomen: Soft, gravid, appropriate for gestational age. Pain/Pressure: Present     Pelvic:  Cervical exam performed      tight 1.5cms/50%/-3  Extremities: Normal range of motion.     Mental Status: Normal mood and affect. Normal behavior. Normal judgment and thought content.   Assessment   30 y.o. G2P1001 at [redacted]w[redacted]d by  09/19/2021, by Ultrasound presenting for routine prenatal visit  Plan   pregnancy2 Problems (from 01/02/21 to present)    Problem Noted Resolved   Marginal placenta previa 06/19/2021 by [name], CNM No   Overview Addendum 09/01/2021  9:43 AM  by Natale Milch, MD    6/24- noted on anatomy scan at 26 weeks. Resolved on 8/10 [place]      Supervision of normal pregnancy 03/31/2021 by Tresea Mall, CNM No   Overview Addendum 09/01/2021  9:41 AM by Natale Milch, MD     Nursing Staff Provider  Office Location  [facility] Dating    Language  English Anatomy US  Complete Marginal placenta, resolved  Flu Vaccine   Genetic Screen  NIPS: declined  TDaP vaccine   07/21/2021 Hgb A1C or  GTT Third trimester : 131  Covid Unvaccinated covid 11/2020   LAB RESULTS   Rhogam  Not needed Blood Type O/Positive/-- (04/08 0917)   Feeding Plan breast Antibody Negative (04/08 0917)  Contraception undecided Rubella 1.57 (04/08 0917)  Circumcision  RPR Non Reactive (07/15 0934)   Pediatrician  [facility] HBsAg Negative (04/08 0917)   Support Person Fiance- Noe HIV Non Reactive (07/15 0934)  Prenatal Classes discussed Varicella immune    GBS  negative  BTL Consent n/a    VBAC Consent n/a Pap  ASCUS/neg HR HPV 2021 Needs repeat PAP postpartum    Hgb Electro  normal    CF      SMA                   Term labor symptoms and general obstetric precautions including but not limited to vaginal bleeding, contractions, leaking of fluid and fetal movement were reviewed in detail with the patient. Please refer to After Visit Summary for other counseling recommendations.   No follow-ups on file.  [name], CNM  09/06/2021 12:03 PM

## 2021-09-15 ENCOUNTER — Encounter: Payer: Self-pay | Admitting: Obstetrics & Gynecology

## 2021-09-15 ENCOUNTER — Other Ambulatory Visit: Payer: Self-pay

## 2021-09-15 ENCOUNTER — Ambulatory Visit (INDEPENDENT_AMBULATORY_CARE_PROVIDER_SITE_OTHER): Payer: No Typology Code available for payment source | Admitting: Obstetrics & Gynecology

## 2021-09-15 VITALS — BP 100/70 | Wt 188.0 lb

## 2021-09-15 DIAGNOSIS — Z3483 Encounter for supervision of other normal pregnancy, third trimester: Secondary | ICD-10-CM

## 2021-09-15 DIAGNOSIS — Z3A39 39 weeks gestation of pregnancy: Secondary | ICD-10-CM

## 2021-09-15 LAB — POCT URINALYSIS DIPSTICK OB
Glucose, UA: NEGATIVE
POC,PROTEIN,UA: NEGATIVE

## 2021-09-15 NOTE — Progress Notes (Signed)
  Subjective  Fetal Movement? yes Contractions? no Leaking Fluid? no Vaginal Bleeding? no  Objective  BP 100/70   Wt 188 lb (85.3 kg)   LMP 01/02/2021 (Within Days)   BMI 29.44 kg/m  General: NAD Pumonary: no increased work of breathing Abdomen: gravid, non-tender Extremities: no edema Psychiatric: mood appropriate, affect full SVE 1-2/80/-3, Vtx, ant, soft Assessment  30 y.o. G2P1001 at [redacted]w[redacted]d by  09/19/2021, by Ultrasound presenting for routine prenatal visit  Plan   Problem List Items Addressed This Visit      Other   Supervision of normal pregnancy - Primary  Other Visit Diagnoses    [redacted] weeks gestation of pregnancy        Monitor for labor    Discussed IOL if necessary postdates    Favorable bishop/cervix PNV, FMC  pregnancy2 Problems (from 01/02/21 to present)    Problem Noted Resolved   Marginal placenta previa 06/19/2021 by [name], CNM No   Overview Addendum 09/01/2021  9:43 AM by Natale Milch, MD    6/24- noted on anatomy scan at 26 weeks. Resolved on 8/10 [place]      Supervision of normal pregnancy 03/31/2021 by Tresea Mall, CNM No   Overview Addendum 09/01/2021  9:41 AM by Natale Milch, MD     Nursing Staff Provider  Office Location  [facility] Dating    Language  English Anatomy US  Complete Marginal placenta, resolved  Flu Vaccine   Genetic Screen  NIPS: declined  TDaP vaccine   07/21/2021 Hgb A1C or  GTT Third trimester : 131  Covid Unvaccinated covid 11/2020   LAB RESULTS   Rhogam  Not needed Blood Type O/Positive/-- (04/08 0917)   Feeding Plan breast Antibody Negative (04/08 0917)  Contraception undecided Rubella 1.57 (04/08 0917)  Circumcision  RPR Non Reactive (07/15 0934)   Pediatrician  [facility] HBsAg Negative (04/08 0917)   Support Person Fiance- Noe HIV Non Reactive (07/15 0934)  Prenatal Classes discussed Varicella immune    GBS  negative  BTL Consent n/a    VBAC Consent n/a Pap  ASCUS/neg HR HPV 2021 Needs  repeat PAP postpartum    Hgb Electro  normal    CF      SMA                Annamarie Major, MD, [facility], [facility] 09/15/2021  10:16 AM

## 2021-09-15 NOTE — Addendum Note (Signed)
Addended by: Cornelius Moras D on: 09/15/2021 10:18 AM   Modules accepted: Orders

## 2021-09-18 ENCOUNTER — Inpatient Hospital Stay
Admission: EM | Admit: 2021-09-18 | Discharge: 2021-09-20 | DRG: 807 | Disposition: A | Discharge status: Home / Self Care | Payer: PRIVATE HEALTH INSURANCE | Attending: Obstetrics and Gynecology | Admitting: Obstetrics and Gynecology

## 2021-09-18 ENCOUNTER — Other Ambulatory Visit: Payer: Self-pay

## 2021-09-18 ENCOUNTER — Encounter: Payer: Self-pay | Admitting: Obstetrics and Gynecology

## 2021-09-18 DIAGNOSIS — Z20822 Contact with and (suspected) exposure to covid-19: Secondary | ICD-10-CM | POA: Diagnosis present

## 2021-09-18 DIAGNOSIS — Z3A40 40 weeks gestation of pregnancy: Secondary | ICD-10-CM | POA: Diagnosis not present

## 2021-09-18 DIAGNOSIS — O480 Post-term pregnancy: Secondary | ICD-10-CM | POA: Diagnosis not present

## 2021-09-18 DIAGNOSIS — Z3490 Encounter for supervision of normal pregnancy, unspecified, unspecified trimester: Secondary | ICD-10-CM

## 2021-09-18 DIAGNOSIS — Z3A39 39 weeks gestation of pregnancy: Secondary | ICD-10-CM

## 2021-09-18 DIAGNOSIS — O26893 Other specified pregnancy related conditions, third trimester: Secondary | ICD-10-CM | POA: Diagnosis present

## 2021-09-18 LAB — CBC
HCT: 31.2 % — ABNORMAL LOW (ref 36.0–46.0)
Hemoglobin: 10.6 g/dL — ABNORMAL LOW (ref 12.0–15.0)
MCH: 29 pg (ref 26.0–34.0)
MCHC: 34 g/dL (ref 30.0–36.0)
MCV: 85.2 fL (ref 80.0–100.0)
Platelets: 278 10*3/uL (ref 150–400)
RBC: 3.66 MIL/uL — ABNORMAL LOW (ref 3.87–5.11)
RDW: 13.4 % (ref 11.5–15.5)
WBC: 11.5 10*3/uL — ABNORMAL HIGH (ref 4.0–10.5)
nRBC: 0 % (ref 0.0–0.2)

## 2021-09-18 LAB — RESP PANEL BY RT-PCR (FLU A&B, COVID) ARPGX2
Influenza A by PCR: NEGATIVE
Influenza B by PCR: NEGATIVE
SARS Coronavirus 2 by RT PCR: NEGATIVE

## 2021-09-18 LAB — TYPE AND SCREEN
ABO/RH(D): O POS
Antibody Screen: NEGATIVE

## 2021-09-18 MED ORDER — TERBUTALINE SULFATE 1 MG/ML IJ SOLN: 0.2500 mg | Freq: Once | INTRAMUSCULAR | Status: DC | PRN

## 2021-09-18 MED ORDER — LACTATED RINGERS IV SOLN: 500.0000 mL | INTRAVENOUS | Status: DC | PRN

## 2021-09-18 MED ORDER — OXYTOCIN-SODIUM CHLORIDE 30-0.9 UT/500ML-% IV SOLN
2.5000 [IU]/h | INTRAVENOUS | Status: DC
  Administered 2021-09-19: 2.5 [IU]/h via INTRAVENOUS

## 2021-09-18 MED ORDER — OXYTOCIN BOLUS FROM INFUSION
333.0000 mL | Freq: Once | INTRAVENOUS | Status: AC
  Administered 2021-09-19: 333 mL via INTRAVENOUS

## 2021-09-18 MED ORDER — LIDOCAINE HCL (PF) 1 % IJ SOLN: 30.0000 mL | INTRAMUSCULAR | Status: DC | PRN

## 2021-09-18 MED ORDER — BUTORPHANOL TARTRATE 1 MG/ML IJ SOLN
1.0000 mg | INTRAMUSCULAR | Status: DC | PRN
  Administered 2021-09-19: 1 mg via INTRAVENOUS
  Filled 2021-09-18: qty 1

## 2021-09-18 MED ORDER — OXYTOCIN-SODIUM CHLORIDE 30-0.9 UT/500ML-% IV SOLN: 1.0000 m[IU]/min | INTRAVENOUS | Status: DC

## 2021-09-18 MED ORDER — ACETAMINOPHEN 325 MG PO TABS: 650.0000 mg | ORAL_TABLET | ORAL | Status: DC | PRN

## 2021-09-18 MED ORDER — ONDANSETRON HCL 4 MG/2ML IJ SOLN: 4.0000 mg | Freq: Four times a day (QID) | INTRAMUSCULAR | Status: DC | PRN

## 2021-09-18 MED ORDER — LACTATED RINGERS IV SOLN: INTRAVENOUS | Status: DC

## 2021-09-18 NOTE — OB Triage Note (Signed)
Pt Michelle Clarke 30 y.o. presents to labor and delivery triage reporting contractions every 5 mins. Pt is a G2P1001 at [redacted]w[redacted]d . Pt denies signs and symptoms consistent with rupture of membranes or active vaginal bleeding. Pt states positive fetal movement. External FM and TOCO applied to non-tender abdomen and assessing. Initial FHR 140. Vital signs obtained and within normal limits. Provider notified of pt.

## 2021-09-18 NOTE — H&P (Signed)
OB History & Physical   History of Present Illness:  Chief Complaint: contractions  HPI:  Michelle Clarke is a 30 y.o. G48P1001 female at [redacted]w[redacted]d dated by 18 week ultrasound.  Her pregnancy has been complicated by marginal previa- resolved, abnormal PAP smear- repeat PAP postpartum .    She reports contractions.   She denies leakage of fluid.   She denies vaginal bleeding.   She reports fetal movement.    Total weight gain for pregnancy: 12.7 kg   Obstetrical Problem List: pregnancy2 Problems (from 01/02/21 to present)     Problem Noted Resolved   Marginal placenta previa 06/19/2021 by [name], CNM No   Overview Addendum 09/01/2021  9:43 AM by Natale Milch, MD    6/24- noted on anatomy scan at 26 weeks. Resolved on 8/10 [place]      Supervision of normal pregnancy 03/31/2021 by Tresea Mall, CNM No   Overview Addendum 09/01/2021  9:41 AM by Natale Milch, MD     Nursing Staff Provider  Office Location  [facility] Dating    Language  English Anatomy US  Complete Marginal placenta, resolved  Flu Vaccine   Genetic Screen  NIPS: declined  TDaP vaccine   07/21/2021 Hgb A1C or  GTT Third trimester : 131  Covid Unvaccinated covid 11/2020   LAB RESULTS   Rhogam  Not needed Blood Type O/Positive/-- (04/08 0917)   Feeding Plan breast Antibody Negative (04/08 0917)  Contraception undecided Rubella 1.57 (04/08 0917)  Circumcision  RPR Non Reactive (07/15 0934)   Pediatrician  [facility] HBsAg Negative (04/08 0917)   Support Person Fiance- Michelle Clarke HIV Non Reactive (07/15 0934)  Prenatal Classes discussed Varicella immune    GBS  negative  BTL Consent n/a    VBAC Consent n/a Pap  ASCUS/neg HR HPV 2021 Needs repeat PAP postpartum    Hgb Electro  normal    CF      SMA                   Maternal Medical History:  History reviewed. No pertinent past medical history.  History reviewed. No pertinent surgical history.  No Known Allergies  Prior to Admission  medications   Medication Sig Start Date End Date Taking? Authorizing Provider  Prenatal Vit-Fe Fumarate-FA (MULTIVITAMIN-PRENATAL) 27-0.8 MG TABS tablet Take 1 tablet by mouth daily at 12 noon.   Yes [provider]    OB History  Gravida Para Term Preterm AB Living  2 1 1     1   SAB IAB Ectopic Multiple Live Births          1    # Outcome Date GA Lbr Len/2nd Weight Sex Delivery Anes PTL Lv  2 Current           1 Term 09/29/10     Vag-Spont       Prenatal care site: [facility]  Social History: She  reports that she has never smoked. She has never used smokeless tobacco. She reports that she does not drink alcohol and does not use drugs.  Family History: family history is not on file.    Review of Systems:  Review of Systems  Constitutional:  Negative for chills and fever.  HENT:  Negative for congestion, ear discharge, ear pain, hearing loss, sinus pain and sore throat.   Eyes:  Negative for blurred vision and double vision.  Respiratory:  Negative for cough, shortness of breath and wheezing.   Cardiovascular:  Negative for chest pain, palpitations and leg swelling.  Gastrointestinal:  Positive for abdominal pain. Negative for blood in stool, constipation, diarrhea, heartburn, melena, nausea and vomiting.  Genitourinary:  Negative for dysuria, flank pain, frequency, hematuria and urgency.  Musculoskeletal:  Negative for back pain, joint pain and myalgias.  Skin:  Negative for itching and rash.  Neurological:  Negative for dizziness, tingling, tremors, sensory change, speech change, focal weakness, seizures, loss of consciousness, weakness and headaches.  Endo/Heme/Allergies:  Negative for environmental allergies. Does not bruise/bleed easily.  Psychiatric/Behavioral:  Negative for depression, hallucinations, memory loss, substance abuse and suicidal ideas. The patient is not nervous/anxious and does not have insomnia.     Physical Exam:  BP 139/90 (BP Location:  Left Arm)   Pulse 80   Temp 98.7 F (37.1 C) (Oral)   Resp 18   Ht 5\' 7"  (1.702 m)   Wt 85.3 kg   LMP 01/02/2021 (Within Days)   BMI 29.44 kg/m   Constitutional: Well nourished, well developed female in no acute distress.  HEENT: normal Skin: Warm and dry.  Cardiovascular: Regular rate and rhythm.   Extremity:  no edema   Respiratory: Clear to auscultation bilateral. Normal respiratory effort Abdomen: FHT present Back: no CVAT Neuro: DTRs 2+, Cranial nerves grossly intact Psych: Alert and Oriented x3. No memory deficits. Normal mood and affect.  MS: normal gait, normal bilateral lower extremity ROM/strength/stability.  Pelvic exam: per RN T [place] 5/90/-1   Baseline FHR: 150 beats/min   Variability: moderate   Accelerations: present   Decelerations: absent Contractions: present frequency: every 5 minutes Overall assessment: reassuring   No results found for: SARSCOV2NAA Results pending  Assessment:  Michelle Clarke is a 30 y.o. G68P1001 female at [redacted]w[redacted]d with active labor.   Plan:  Admit to Labor & Delivery  CBC, T&S, Clrs, IVF GBS negative.   Fetwal well-being: Category I Pitocin titration/AROM as needed    [redacted]w[redacted]d, CNM 09/18/2021 10:17 PM

## 2021-09-19 ENCOUNTER — Encounter: Payer: Self-pay | Admitting: Advanced Practice Midwife

## 2021-09-19 DIAGNOSIS — O480 Post-term pregnancy: Secondary | ICD-10-CM

## 2021-09-19 DIAGNOSIS — Z3A40 40 weeks gestation of pregnancy: Secondary | ICD-10-CM

## 2021-09-19 DIAGNOSIS — Z3490 Encounter for supervision of normal pregnancy, unspecified, unspecified trimester: Secondary | ICD-10-CM

## 2021-09-19 LAB — CBC
HCT: 31.1 % — ABNORMAL LOW (ref 36.0–46.0)
Hemoglobin: 10.4 g/dL — ABNORMAL LOW (ref 12.0–15.0)
MCH: 28.3 pg (ref 26.0–34.0)
MCHC: 33.4 g/dL (ref 30.0–36.0)
MCV: 84.5 fL (ref 80.0–100.0)
Platelets: 270 10*3/uL (ref 150–400)
RBC: 3.68 MIL/uL — ABNORMAL LOW (ref 3.87–5.11)
RDW: 13.4 % (ref 11.5–15.5)
WBC: 17.8 10*3/uL — ABNORMAL HIGH (ref 4.0–10.5)
nRBC: 0 % (ref 0.0–0.2)

## 2021-09-19 LAB — RPR: RPR Ser Ql: NONREACTIVE

## 2021-09-19 LAB — ABO/RH: ABO/RH(D): O POS

## 2021-09-19 MED ORDER — SIMETHICONE 80 MG PO CHEW: 80.0000 mg | CHEWABLE_TABLET | ORAL | Status: DC | PRN

## 2021-09-19 MED ORDER — SENNOSIDES-DOCUSATE SODIUM 8.6-50 MG PO TABS
2.0000 | ORAL_TABLET | Freq: Every day | ORAL | Status: DC
  Administered 2021-09-20: 2 via ORAL
  Filled 2021-09-19: qty 2

## 2021-09-19 MED ORDER — DIPHENHYDRAMINE HCL 25 MG PO CAPS: 25.0000 mg | ORAL_CAPSULE | Freq: Four times a day (QID) | ORAL | Status: DC | PRN

## 2021-09-19 MED ORDER — PRENATAL MULTIVITAMIN CH
1.0000 | ORAL_TABLET | Freq: Every day | ORAL | Status: DC
  Administered 2021-09-19 – 2021-09-20 (×2): 1 via ORAL
  Filled 2021-09-19 (×2): qty 1

## 2021-09-19 MED ORDER — ONDANSETRON HCL 4 MG/2ML IJ SOLN: 4.0000 mg | INTRAMUSCULAR | Status: DC | PRN

## 2021-09-19 MED ORDER — ACETAMINOPHEN 325 MG PO TABS: 650.0000 mg | ORAL_TABLET | ORAL | Status: DC | PRN

## 2021-09-19 MED ORDER — WITCH HAZEL-GLYCERIN EX PADS
1.0000 "application " | MEDICATED_PAD | CUTANEOUS | Status: DC | PRN
  Administered 2021-09-19: 1 via TOPICAL
  Filled 2021-09-19: qty 100

## 2021-09-19 MED ORDER — ONDANSETRON HCL 4 MG PO TABS: 4.0000 mg | ORAL_TABLET | ORAL | Status: DC | PRN

## 2021-09-19 MED ORDER — DIBUCAINE (PERIANAL) 1 % EX OINT
1.0000 "application " | TOPICAL_OINTMENT | CUTANEOUS | Status: DC | PRN
  Administered 2021-09-19: 1 via RECTAL
  Filled 2021-09-19: qty 28

## 2021-09-19 MED ORDER — IBUPROFEN 600 MG PO TABS
600.0000 mg | ORAL_TABLET | Freq: Four times a day (QID) | ORAL | Status: DC
  Administered 2021-09-19 – 2021-09-20 (×5): 600 mg via ORAL
  Filled 2021-09-19 (×6): qty 1

## 2021-09-19 MED ORDER — BENZOCAINE-MENTHOL 20-0.5 % EX AERO
1.0000 "application " | INHALATION_SPRAY | CUTANEOUS | Status: DC | PRN
  Administered 2021-09-19: 1 via TOPICAL
  Filled 2021-09-19: qty 56

## 2021-09-19 MED ORDER — COCONUT OIL OIL: 1.0000 "application " | TOPICAL_OIL | Status: DC | PRN

## 2021-09-19 MED ORDER — TETANUS-DIPHTH-ACELL PERTUSSIS 5-2.5-18.5 LF-MCG/0.5 IM SUSY: 0.5000 mL | PREFILLED_SYRINGE | Freq: Once | INTRAMUSCULAR | Status: DC

## 2021-09-19 NOTE — Lactation Note (Signed)
This note was copied from a baby's chart. Lactation Consultation Note  Patient Name: Michelle Clarke LOVFI'E Date: 09/19/2021   Age:30 hours  Initial lactation visit. Mom is P2, delivered 6hrs ago. Breastfed her first along with formula supplementation for 12 months, and plans to breastfeed with BB as well.   Baby has fed 2x since delivery, no pain/discomfort, tugging noted.   LC reviewed newborn basics: newborn feeding patterns and behaviors, stomach size, early cues, feeding on demand and not the clock, 8-12 attempts in first 24 hours, and output expectations. Encouraged skin to skin and rest when possible.  Mom had no questions/concerns. Whiteboard updated with LC name/number; encouraged to call for BF support as needed.  Maternal Data    Feeding    LATCH Score                    Lactation Tools Discussed/Used    Interventions    Discharge    Consult Status      Danford Bad 09/19/2021, 4:55 PM

## 2021-09-19 NOTE — Progress Notes (Signed)
Admit Date: 09/18/2021 Today's Date: 09/19/2021  Post Partum Day 0  Subjective:  no complaints, up ad lib, voiding, and tolerating PO  Objective: Temp:  [98.1 F (36.7 C)-98.7 F (37.1 C)] 98.6 F (37 C) (09/27 1140) Pulse Rate:  [66-80] 80 (09/27 1140) Resp:  [16-18] 18 (09/27 1140) BP: (118-139)/(71-90) 118/83 (09/27 1140) SpO2:  [97 %-99 %] 99 % (09/27 1140) Weight:  [85.3 kg] 85.3 kg (09/26 2128)  Physical Exam:  General: alert, cooperative, and no distress Lochia: appropriate Uterine Fundus: firm Incision: none DVT Evaluation: No evidence of DVT seen on physical exam.  Recent Labs    09/18/21 2210 09/19/21 0620  HGB 10.6* 10.4*  HCT 31.2* 31.1*    Assessment/Plan: Plan for discharge tomorrow, Breastfeeding, Contraception (POP), and Infant doing well   LOS: 1 day   Letitia Libra [facility] 09/19/2021, 1:15 PM

## 2021-09-19 NOTE — Discharge Summary (Signed)
OB Discharge Summary     Patient Name: Michelle Clarke DOB: 05/22/1991 MRN: [id number]  Date of admission: 09/18/2021 Delivering provider: Tresea Mall, CNM  Date of Delivery: 09/19/2021  Date of discharge: 09/20/2021  Admitting diagnosis: Labor and delivery, indication for care [O75.9] Term pregnancy [Z34.90] Intrauterine pregnancy: [redacted]w[redacted]d     Secondary diagnosis: None     Discharge diagnosis: Term Pregnancy Delivered                                                                                                Post partum procedures: none  Augmentation: n/a  Complications: None  Hospital course:  Onset of Labor With Vaginal Delivery      30 y.o. yo G2P1001 at [redacted]w[redacted]d was admitted in Active Labor on 09/18/2021. Patient had an uncomplicated labor course as follows:  Membrane Rupture Time/Date: 2:40 AM ,09/19/2021   Delivery Method:Vaginal, Spontaneous  Episiotomy: None  Lacerations:  1st degree  See delivery note for details  Patient had an uncomplicated postpartum course.  She is tolerating regular diet, her pain is controlled with PO medications, she is ambulating and voiding without difficulty.  Patient is discharged home in stable condition on 09/20/21.  Newborn Data: Birth date:09/19/2021  Birth time:3:35 AM  Gender:Female  Maximo Draco Living status:Living  Apgars:8, 9 Weight:3740 g, 8 pounds 4 ounces  Physical exam  Vitals:   09/19/21 1140 09/19/21 1542 09/19/21 2257 09/20/21 0737  BP: 118/83 130/70 135/82 129/71  Pulse: 80 77 77 81  Resp: 18 18 18 18   Temp: 98.6 F (37 C) 99 F (37.2 C) 97.8 F (36.6 C) 98.4 F (36.9 C)  TempSrc: Oral Oral Oral Oral  SpO2: 99% 98% 98% 98%  Weight:      Height:       General: alert, cooperative, and no distress Lochia: appropriate Uterine Fundus: firm Incision: N/A DVT Evaluation: No evidence of DVT seen on physical exam. Negative Homan's sign.  Labs: Lab Results  Component Value Date   WBC 17.8 (H) 09/19/2021   HGB  10.4 (L) 09/19/2021   HCT 31.1 (L) 09/19/2021   MCV 84.5 09/19/2021   PLT 270 09/19/2021    Discharge instruction: per After Visit Summary.  Medications:  Allergies as of 09/20/2021   No Known Allergies      Medication List     TAKE these medications    ibuprofen 600 MG tablet Commonly known as: ADVIL Take 1 tablet (600 mg total) by mouth every 6 (six) hours.   multivitamin-prenatal 27-0.8 MG Tabs tablet Take 1 tablet by mouth daily at 12 noon.        Diet: routine diet  Activity: Advance as tolerated. Pelvic rest for 6 weeks.   Outpatient follow up:  Follow-up Information     09/22/2021, CNM. Schedule an appointment as soon as possible for a visit in 6 week(s).   Specialty: Obstetrics Why: postpartum follow up. she has an ASCUS pap and will need a pap smear at her 6 week PP visit. Contact information: [address] [place] [phone]  Postpartum contraception: Undecided Rhogam Given postpartum: Rh positive Rubella vaccine given postpartum: immune Varicella vaccine given postpartum: immune TDaP given antepartum or postpartum: given antepartum   Newborn Delivery   Birth date/time: 09/19/2021 03:35:00 Delivery type: Vaginal, Spontaneous      Baby Feeding: Breast  Disposition:home with mother  SIGNED:  [name], CNM 09/20/2021 10:51 AM

## 2021-09-20 MED ORDER — IBUPROFEN 600 MG PO TABS: 600.0000 mg | ORAL_TABLET | Freq: Four times a day (QID) | ORAL | 0 refills | Status: AC

## 2021-09-20 NOTE — Progress Notes (Signed)
Mother discharged.  Discharge instructions given.  Mother verbalizes understanding.  Transported by auxiliary.  

## 2021-09-20 NOTE — Lactation Note (Signed)
This note was copied from a baby's chart. Lactation Consultation Note  Patient Name: Michelle Clarke [id number] Date: 09/20/2021 Reason for consult: Follow-up assessment;Term Age:30 hours  Lactation follow-up prior to anticipated discharge. Mom and baby have been independent with feedings. Baby has voided/stooled and passed 24hr screens.   Mom reports no pain/discomfort, baby active at breast with flanged lips, deep latch and audible swallows. LC reviewed continued feeding on demand, 8 or more feedings every 24hrs, cluster feeding, and output expectations.   Guidance given for anticipated breast changes, management of breast fullness and engorgement, softening of breast tissue as needed to achieve deep latch or for comfort between feedings. Information for outpatient lactation services and community support. Encouraged to call with questions/concerns and for ongoing BF support.  Maternal Data Has patient been taught Hand Expression?: Yes Does the patient have breastfeeding experience prior to this delivery?: Yes How long did the patient breastfeed?: 12 months  Feeding Mother's Current Feeding Choice: Breast Milk  LATCH Score Latch: Grasps breast easily, tongue down, lips flanged, rhythmical sucking.  Audible Swallowing: Spontaneous and intermittent  Type of Nipple: Everted at rest and after stimulation  Comfort (Breast/Nipple): Soft / non-tender  Hold (Positioning): No assistance needed to correctly position infant at breast.  LATCH Score: 10   Lactation Tools Discussed/Used    Interventions Interventions: Education;Hand express;Pre-pump if needed;Coconut oil  Discharge Discharge Education: Engorgement and breast care;Warning signs for feeding baby;Outpatient recommendation  Consult Status Consult Status: Complete Date: 09/20/21 Follow-up type: Call as needed    Danford Bad 09/20/2021, 9:22 AM

## 2021-09-20 NOTE — Discharge Instructions (Signed)

## 2021-09-22 ENCOUNTER — Encounter: Payer: No Typology Code available for payment source | Admitting: Obstetrics and Gynecology

## 2021-09-23 IMAGING — US US OB COMP +14 WK
1 series · 13 of 28 positions shown · non-contrast
Comparison: none

CLINICAL DATA: Gravida 2 para 1.  29-year-old.  Scan for anatomy.

EXAM:
OBSTETRICAL ULTRASOUND >14 WKS

[Series 1: us ob comp +14 wk · 0.25mm/px · 13 of 101 slices shown]
[im 4/101]
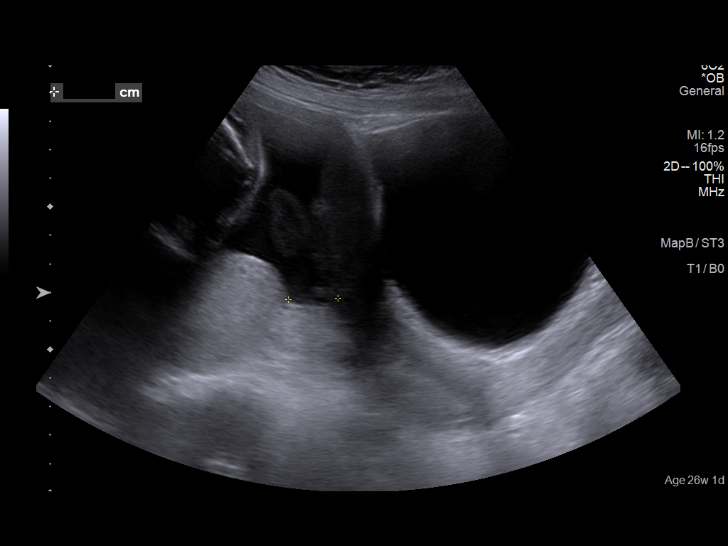
[im 12/101]
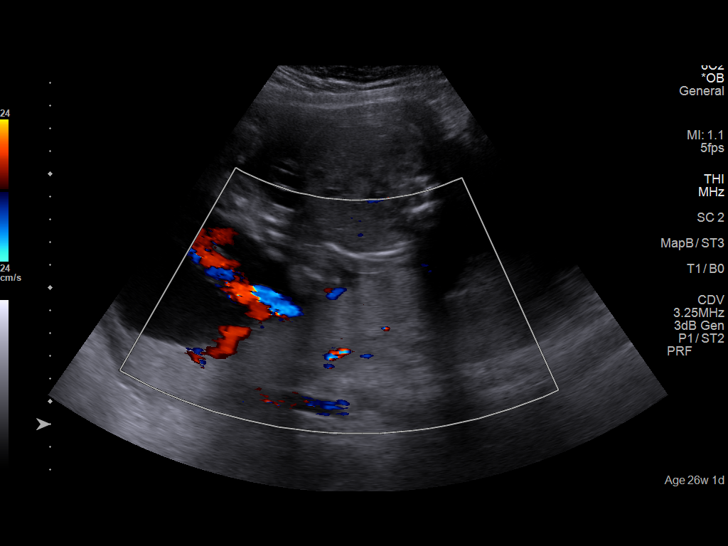
[im 19/101]
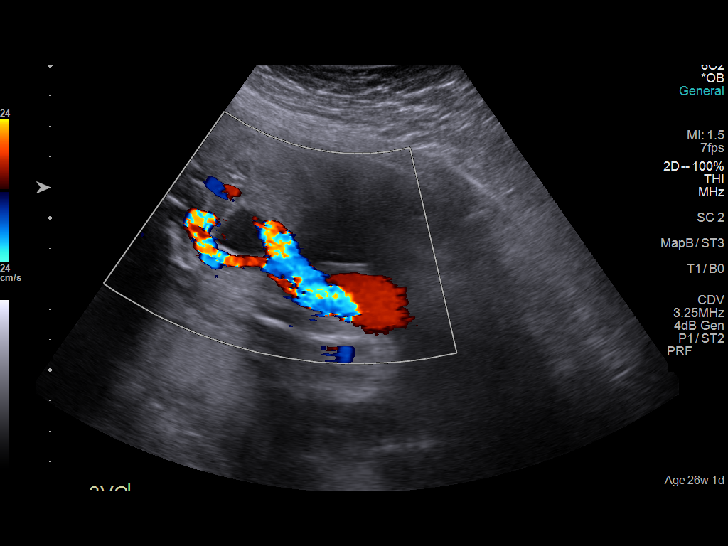
[im 26/101]
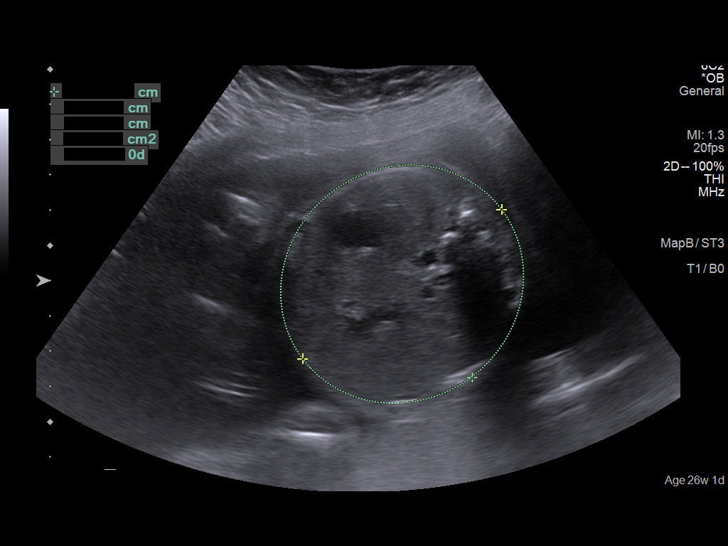
[im 34/101]
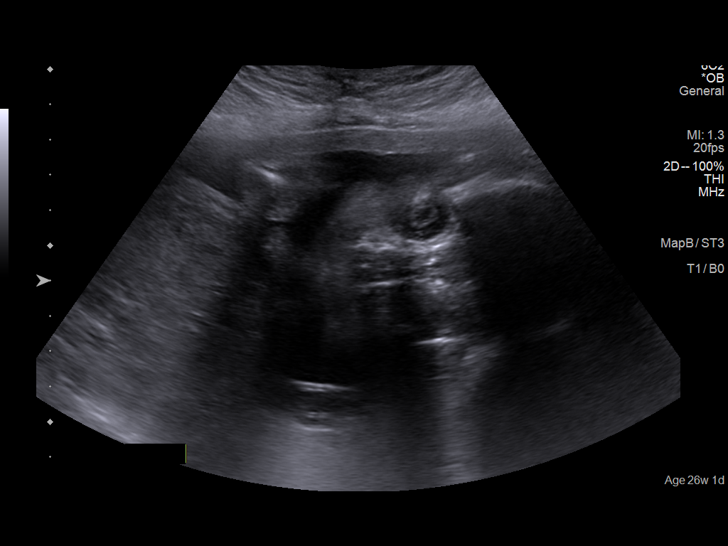
[im 41/101]
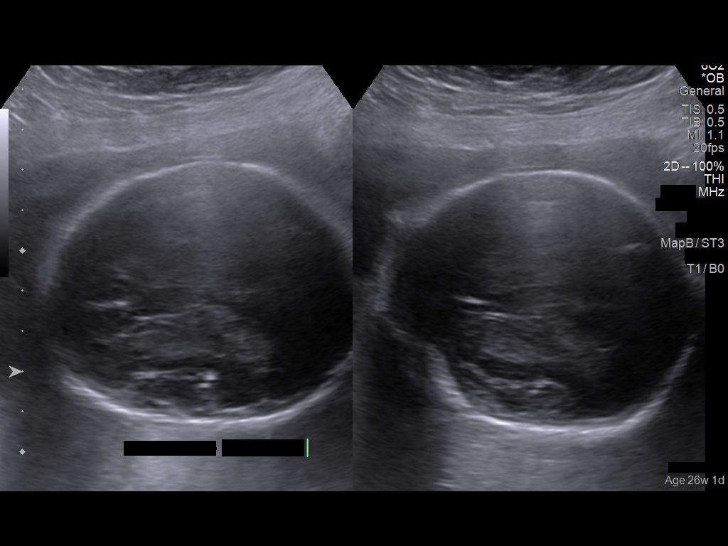
[im 52/101]
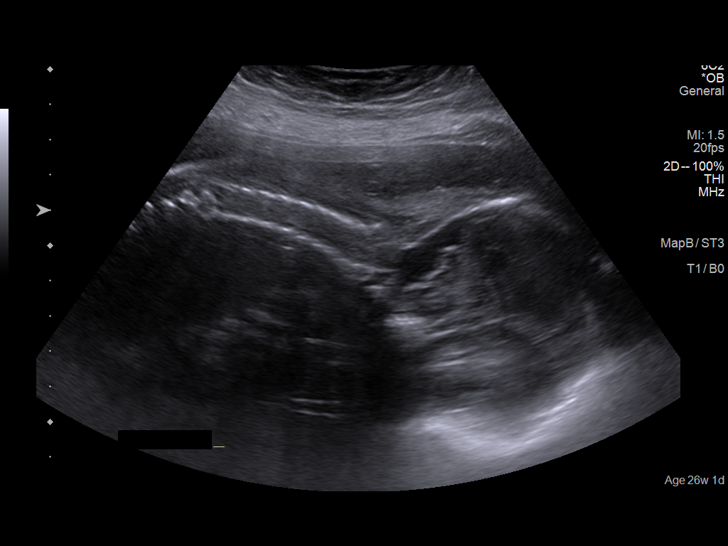
[im 60/101]
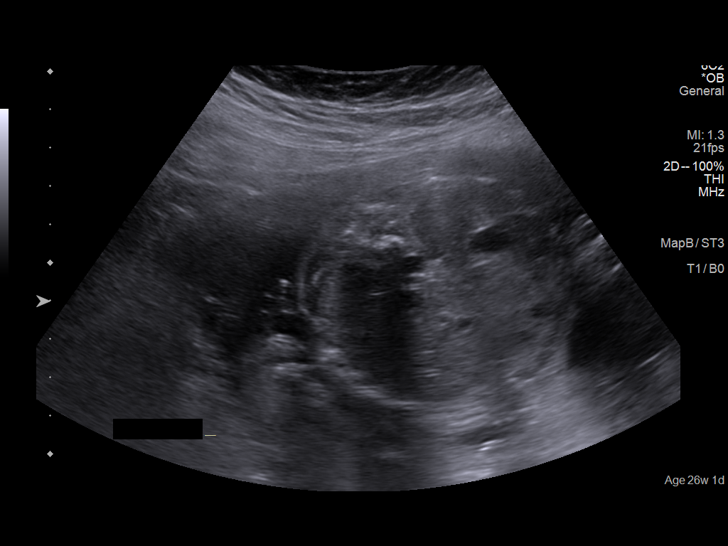
[im 67/101]
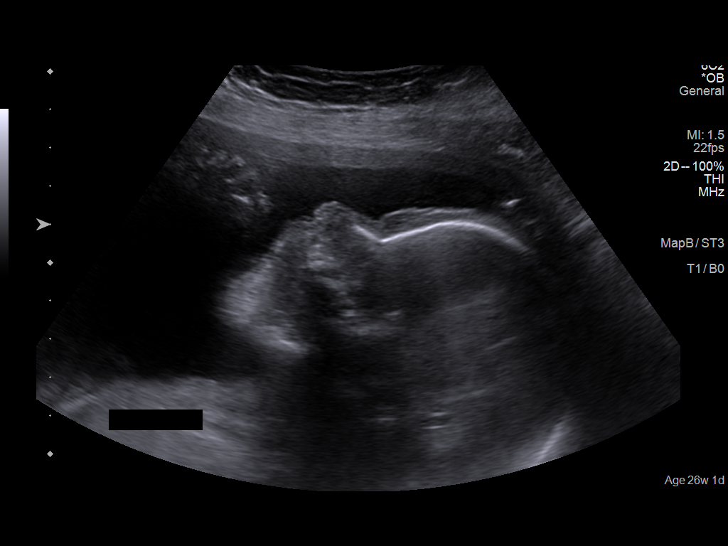
[im 75/101]
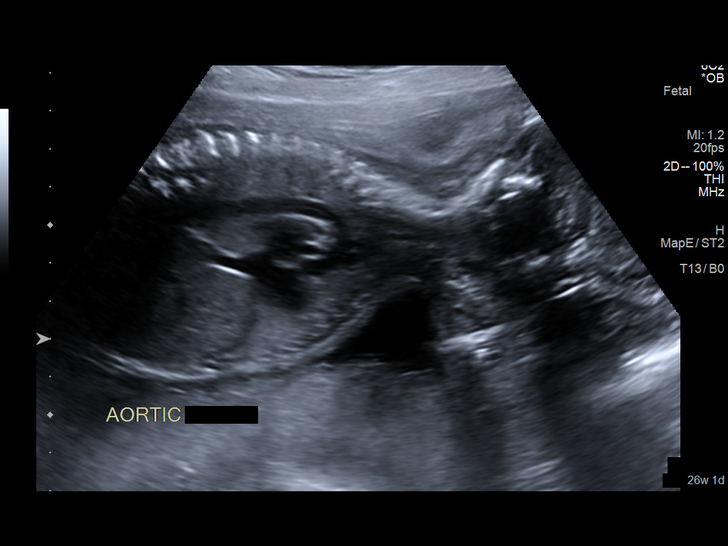
[im 82/101]
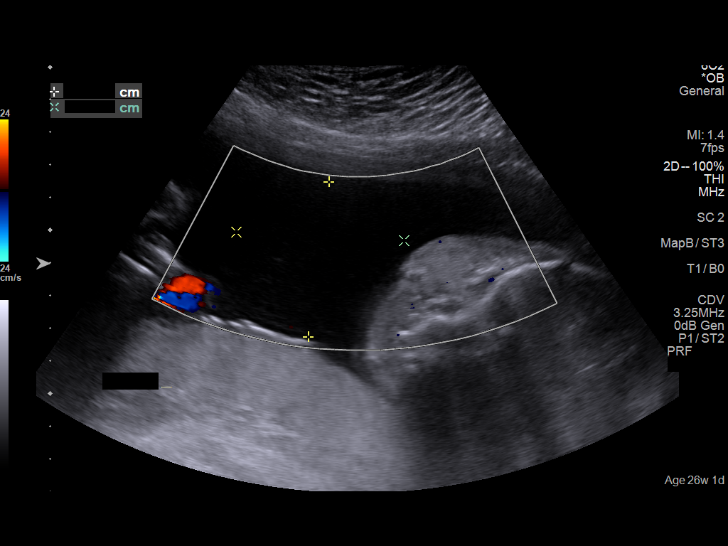
[im 89/101]
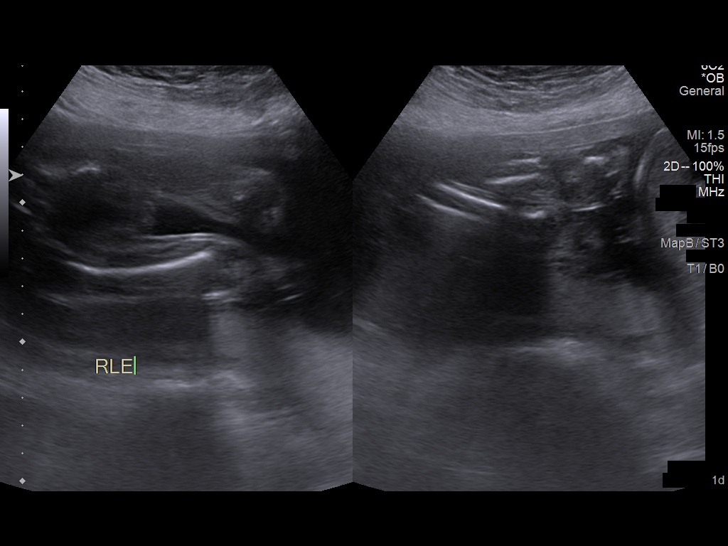
[im 97/101]
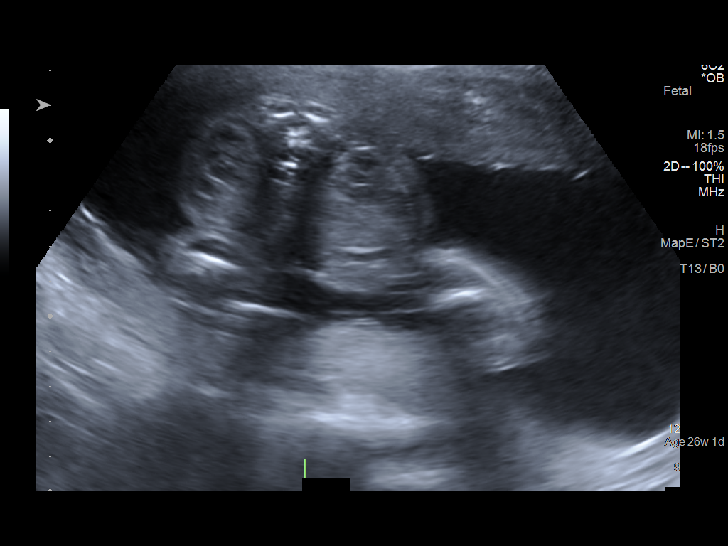

[13 of 28 positions shown; findings below may reference images not displayed]

FINDINGS: Number of Fetuses: 1

Heart Rate:  126 bpm

Movement: Present

Presentation: Transverse

Previa: Marginal, placental edge to internal os is 1.7 centimeters.

Placental Location: Posterior

Amniotic Fluid (Subjective): Normal

Amniotic Fluid (Objective):

Vertical pocket = 4.8cm

FETAL BIOMETRY

BPD: 6.38cm 25w 6d

HC:   24.42cm 26w 4d

AC:   22.1cm 26w 4d

FL:   4.93cm 26w 4d

Current Mean GA: 26w 5d US EDC: 09/15/2021

Assigned GA: 26w 1d Assigned EDC: 09/19/2021 (performed at [facility])

FETAL ANATOMY

Lateral Ventricles: Appears normal

Thalami/CSP: Appears normal

Posterior Fossa:  Appears normal

Nuchal Region: Not applicable

Upper Lip: Appears normal

Spine: Appears normal

4 Chamber Heart on Left: Appears normal

LVOT: Appears normal

RVOT: Appears normal

Stomach on Left: Appears normal

3 Vessel Cord: Appears normal

Cord Insertion site: Appears normal

Kidneys: Appears normal

Bladder: Appears normal

Extremities: Appears normal

Sex: Male

Technically difficult due to: Not applicable

Maternal Findings:

Cervix:  4.5 centimeters on transabdominal evaluation
IMPRESSION: 1. Single living intrauterine fetus in transverse presentation.
2. Marginal placental previa. Posterior placental edge is
centimeters to the internal os.
3. Today's ultrasound dating correlates well with reported dating
performed at [facility].
4. No fetal anomalies identified.

## 2021-09-29 ENCOUNTER — Encounter: Payer: No Typology Code available for payment source | Admitting: Obstetrics and Gynecology

## 2021-10-30 ENCOUNTER — Other Ambulatory Visit (HOSPITAL_COMMUNITY)
Admission: RE | Admit: 2021-10-30 | Discharge: 2021-10-30 | Disposition: A | Discharge status: Home / Self Care | Payer: PRIVATE HEALTH INSURANCE | Source: Ambulatory Visit | Attending: Advanced Practice Midwife | Admitting: Advanced Practice Midwife

## 2021-10-30 ENCOUNTER — Ambulatory Visit (INDEPENDENT_AMBULATORY_CARE_PROVIDER_SITE_OTHER): Payer: No Typology Code available for payment source | Admitting: Advanced Practice Midwife

## 2021-10-30 ENCOUNTER — Other Ambulatory Visit: Payer: Self-pay

## 2021-10-30 DIAGNOSIS — Z124 Encounter for screening for malignant neoplasm of cervix: Secondary | ICD-10-CM

## 2021-10-30 DIAGNOSIS — Z3041 Encounter for surveillance of contraceptive pills: Secondary | ICD-10-CM

## 2021-10-30 DIAGNOSIS — Z8742 Personal history of other diseases of the female genital tract: Secondary | ICD-10-CM

## 2021-10-30 MED ORDER — NORETHINDRONE 0.35 MG PO TABS
1.0000 | ORAL_TABLET | Freq: Every day | ORAL | 3 refills | Status: AC
Start: 2021-10-30 — End: ?

## 2021-10-31 ENCOUNTER — Encounter: Payer: Self-pay | Admitting: Advanced Practice Midwife

## 2021-10-31 NOTE — Progress Notes (Addendum)
Postpartum Visit  Date of Service: 10/30/2021  Chief Complaint:  Chief Complaint  Patient presents with   Postpartum Care    History of Present Illness: Patient is a 30 y.o. Clarke presents for postpartum visit.  Review the Delivery Report for details.   Date of delivery: 9/Michelle/2022 Type of delivery: Vaginal delivery - Vacuum or forceps assisted  no Episiotomy No.  Laceration: first degree hemostatic  Pregnancy or labor problems:  no Any problems since the delivery:  no  Newborn Details:  SINGLETON :  1. BabyGender female. Birth weight: 8 pounds 4 ounces Maternal Details:  Breast or formula feeding: Breast and Formula Intercourse: No  Contraception after delivery:  POPs Any bowel or bladder issues: No  Post partum depression/anxiety noted:  no Edinburgh Post-Partum Depression Score: 4 Date of last PAP: 08/26/20  ASCUS with NEGATIVE high risk HPV  Review of Systems: Review of Systems  Constitutional:  Negative for chills and fever.  HENT:  Negative for congestion, ear discharge, ear pain, hearing loss, sinus pain and sore throat.   Eyes:  Negative for blurred vision and double vision.  Respiratory:  Negative for cough, shortness of breath and wheezing.   Cardiovascular:  Negative for chest pain, palpitations and leg swelling.  Gastrointestinal:  Negative for abdominal pain, blood in stool, constipation, diarrhea, heartburn, melena, nausea and vomiting.  Genitourinary:  Negative for dysuria, flank pain, frequency, hematuria and urgency.  Musculoskeletal:  Negative for back pain, joint pain and myalgias.  Skin:  Negative for itching and rash.  Neurological:  Negative for dizziness, tingling, tremors, sensory change, speech change, focal weakness, seizures, loss of consciousness, weakness and headaches.  Endo/Heme/Allergies:  Negative for environmental allergies. Does not bruise/bleed easily.  Psychiatric/Behavioral:  Negative for depression, hallucinations, memory loss,  substance abuse and suicidal ideas. The patient is not nervous/anxious and does not have insomnia.     Past Medical History:  History reviewed. No pertinent past medical history.  Past Surgical History:  History reviewed. No pertinent surgical history.  Family History:  History reviewed. No pertinent family history.  Social History:  Social History   Socioeconomic History   Marital status: Significant Other    Spouse name: Noe   Number of children: Not on file   Years of education: Not on file   Highest education level: Not on file  Occupational History   Not on file  Tobacco Use   Smoking status: Never   Smokeless tobacco: Never  Vaping Use   Vaping Use: Never used  Substance and Sexual Activity   Alcohol use: Never   Drug use: Never   Sexual activity: Yes    Birth control/protection: Pill  Other Topics Concern   Not on file  Social History Narrative   Not on file   Social Determinants of Health   Financial Resource Strain: Not on file  Food Insecurity: Not on file  Transportation Needs: Not on file  Physical Activity: Not on file  Stress: Not on file  Social Connections: Not on file  Intimate Partner Violence: Not on file    Allergies:  No Known Allergies  Medications: Prior to Admission medications   Medication Sig Start Date End Date Taking? Authorizing Provider  norethindrone (MICRONOR) 0.35 MG tablet Take 1 tablet (0.35 mg total) by mouth daily. 10/30/21  Yes Tresea Mall, CNM  Prenatal Vit-Fe Fumarate-FA (MULTIVITAMIN-PRENATAL) Michelle-0.8 MG TABS tablet Take 1 tablet by mouth daily at 12 noon.   Yes [provider]  ibuprofen (  ADVIL) 600 MG tablet Take 1 tablet (600 mg total) by mouth every 6 (six) hours. Patient not taking: Reported on 10/30/2021 09/20/21   Imagene Riches, CNM    Physical Exam Blood pressure 120/80, height 5\' 7"  (1.702 m), weight 171 lb (77.6 kg), currently breastfeeding.    General: NAD HEENT: normocephalic,  anicteric Pulmonary: No increased work of breathing Abdomen: NABS, soft, non-tender, non-distended.  Umbilicus without lesions.  No hepatomegaly, splenomegaly or masses palpable. No evidence of hernia. Genitourinary:  External: Normal external female genitalia.  Normal urethral meatus, normal  Bartholin's and Skene's glands.    Vagina: Normal vaginal mucosa, no evidence of prolapse.    Cervix: Grossly normal in appearance, friable, no CMT  Uterus: Non-enlarged, mobile, normal contour.    Adnexa: ovaries non-enlarged, no adnexal masses  Rectal: deferred Extremities: no edema, erythema, or tenderness Neurologic: Grossly intact Psychiatric: mood appropriate, affect full   Edinburgh Postnatal Depression Scale - 10/30/21 0933       Edinburgh Postnatal Depression Scale:  In the Past 7 Days   I have been able to laugh and see the funny side of things. 0    I have looked forward with enjoyment to things. 1    I have blamed myself unnecessarily when things went wrong. 1    I have been anxious or worried for no good reason. 0    I have felt scared or panicky for no good reason. 0    Things have been getting on top of me. 2    I have been so unhappy that I have had difficulty sleeping. 0    I have felt sad or miserable. 0    I have been so unhappy that I have been crying. 0    The thought of harming myself has occurred to me. 0    Edinburgh Postnatal Depression Scale Total 4             Assessment: 30 y.o. [id number] presenting for 6 week postpartum visit  Plan: Problem List Items Addressed This Visit   None Visit Diagnoses     6 weeks postpartum follow-up    -  Primary   Relevant Medications   norethindrone (MICRONOR) 0.35 MG tablet   Other Relevant Orders   Cytology - PAP   Encounter for surveillance of contraceptive pills       Relevant Medications   norethindrone (MICRONOR) 0.35 MG tablet   Screening for cervical cancer       Relevant Orders   Cytology - PAP   Hx of  abnormal cervical Pap smear       Relevant Orders   Cytology - PAP        1) Contraception - Education given regarding options for contraception, as well as compatibility with breast feeding if applicable.  Patient plans on oral progesterone-only contraceptive for contraception.  2)  Pap - ASCCP guidelines and rationale discussed.  Patient opts for every 3 years screening interval following normal PAP. PAP today  3) Patient underwent screening for postpartum depression with no signs of depression  4) Return in about 1 year (around 10/30/2022) for annual established gyn.   Rod Can, [facility] 10/31/2021, 10:16 AM

## 2021-11-06 LAB — CYTOLOGY - PAP
Comment: NEGATIVE
Diagnosis: NEGATIVE
High risk HPV: NEGATIVE

## 2021-11-10 IMAGING — US US OB FOLLOW-UP
1 series · 15 of 28 positions shown · non-contrast
Comparison: none

CLINICAL DATA: Follow-up marginal placenta previa and fetal growth.

EXAM:
OBSTETRIC 14+ WK ULTRASOUND FOLLOW-UP

[Series 1: us ob follow up · 15 of 53 slices shown]
[im 1/53]
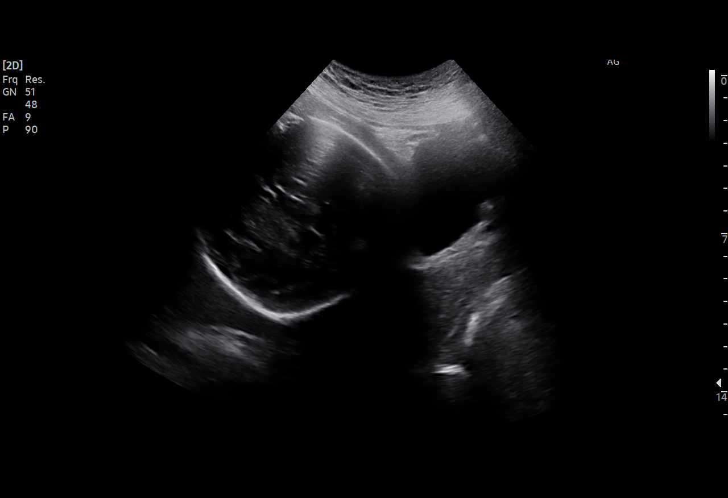
[im 4/53]
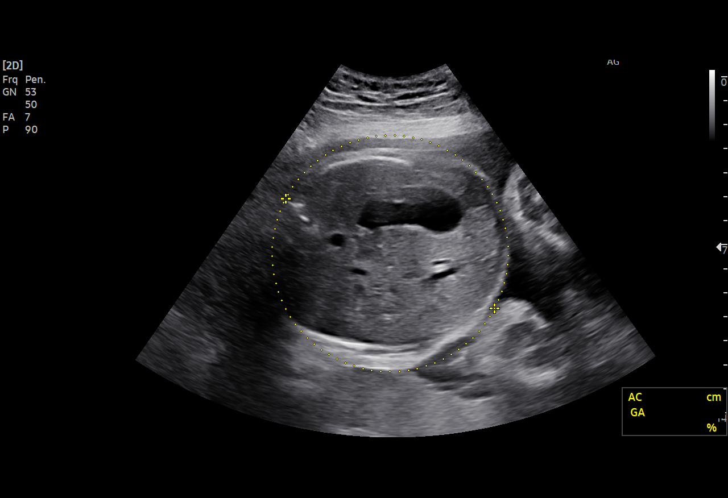
[im 8/53]
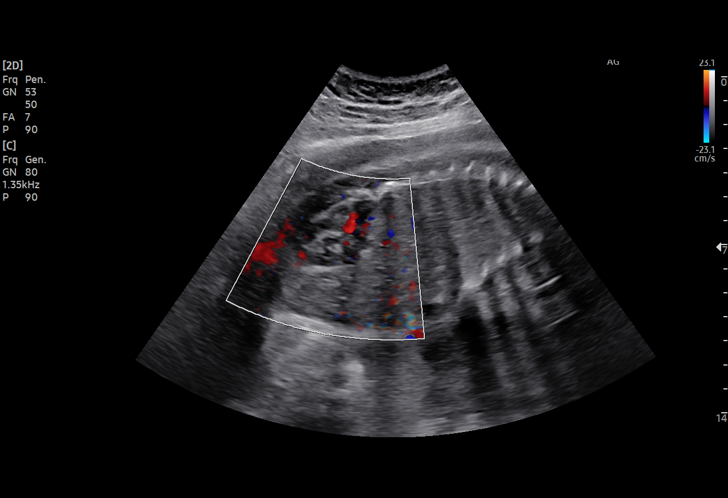
[im 12/53]
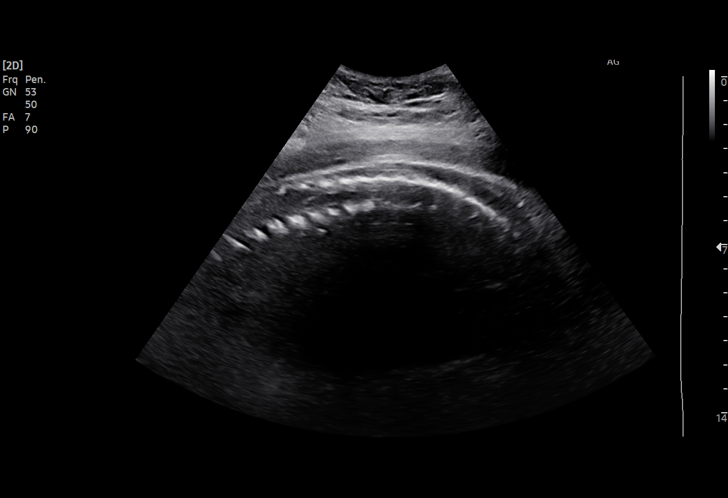
[im 16/53]
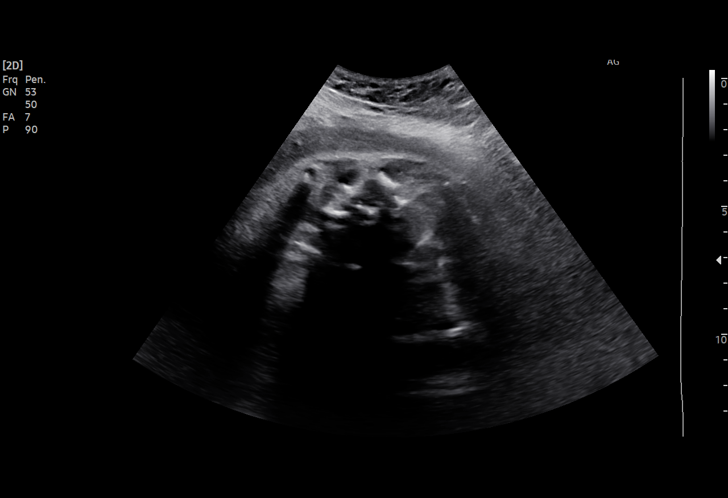
[im 20/53]
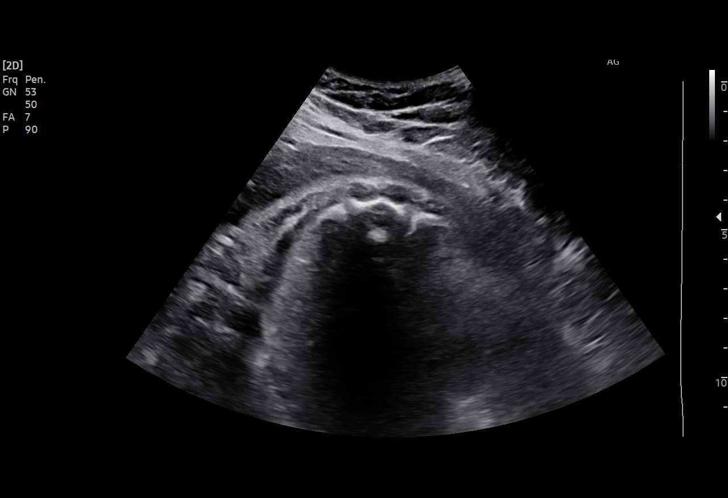
[im 24/53]
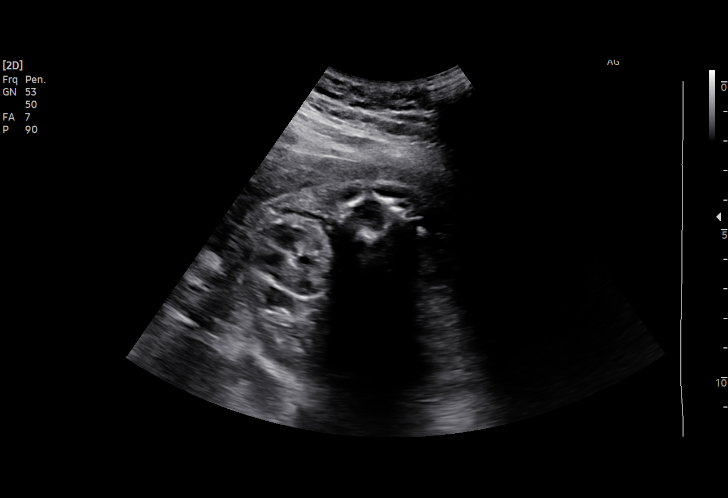
[im 27/53]
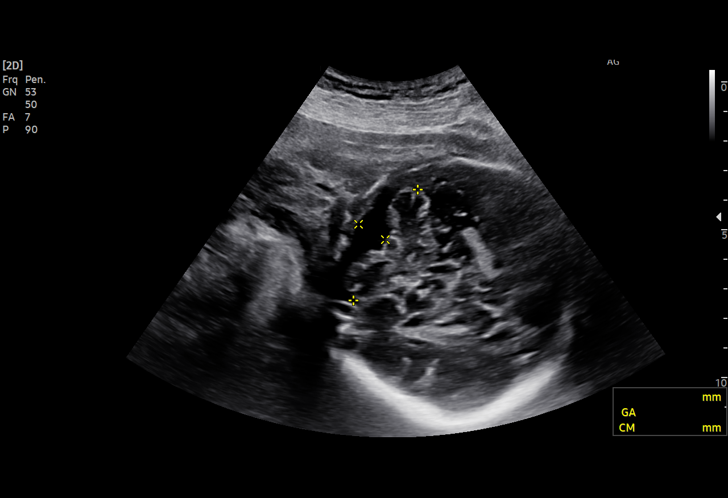
[im 29/53]
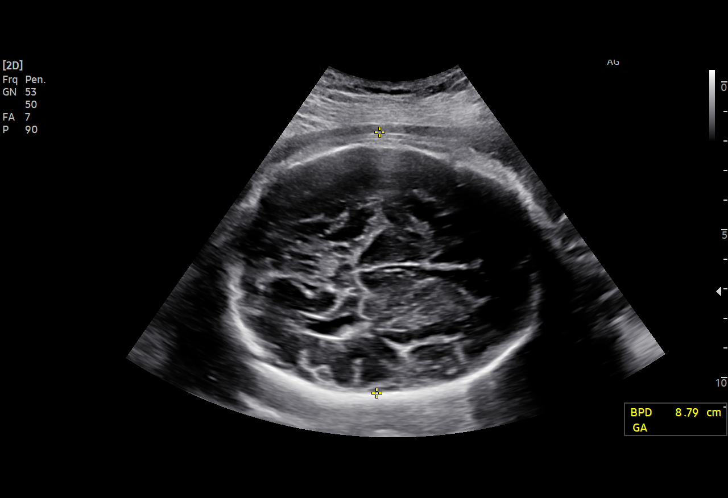
[im 33/53]
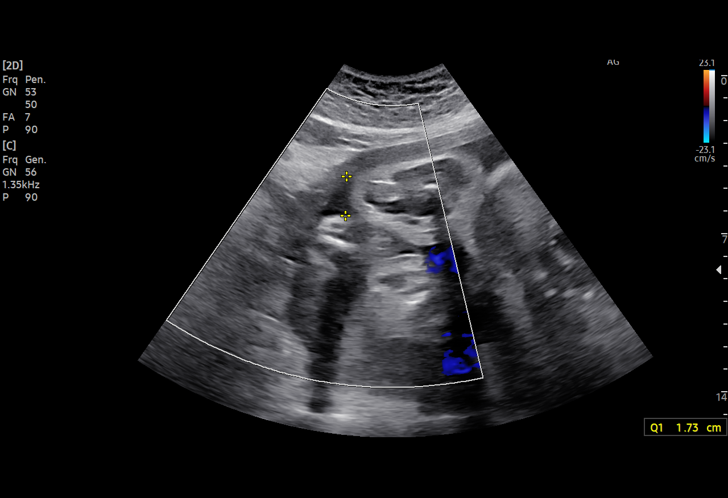
[im 37/53]
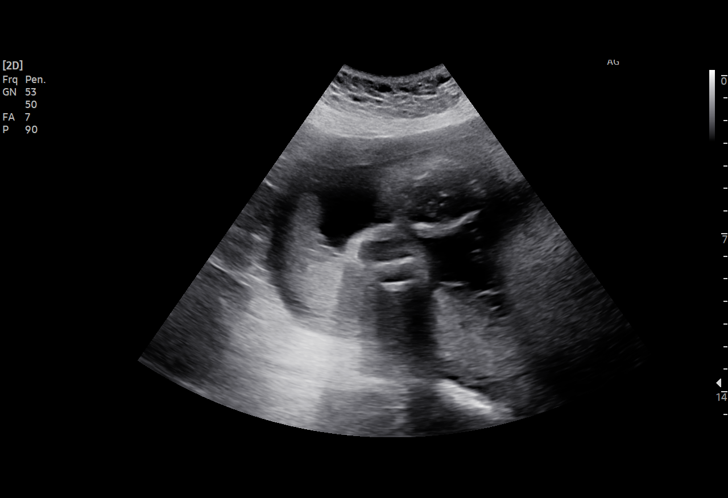
[im 41/53]
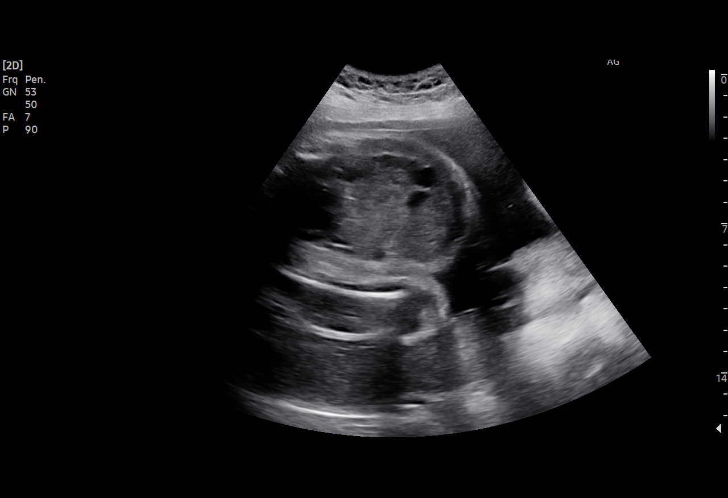
[im 45/53]
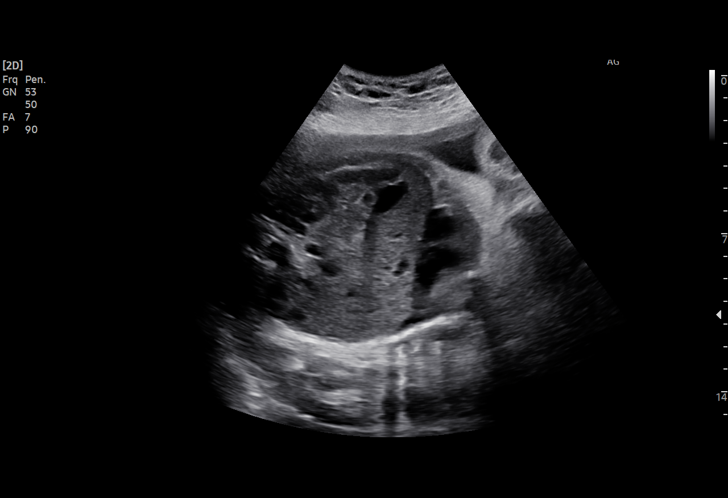
[im 49/53]
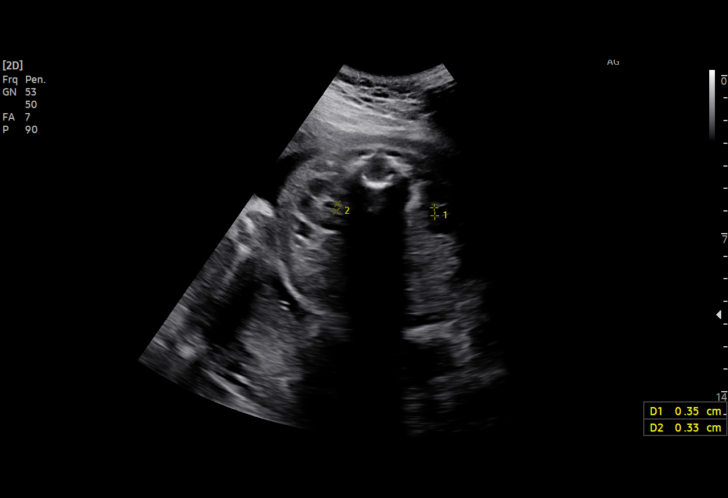
[im 53/53]
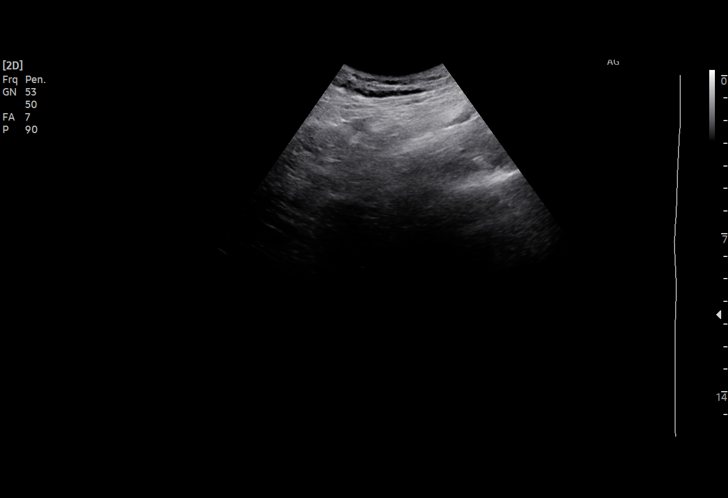

[15 of 28 positions shown; findings below may reference images not displayed]

FINDINGS: Number of Fetuses: 1

Heart Rate:  150 bpm

Movement: Yes

Presentation: Cephalic

Previa: No

Placental Location: Posterior

Amniotic Fluid (Subjective): Within normal limits

Amniotic Fluid (Objective):

AFI 10.9 cm (5%ile= 7.9 cm, 95%= 24.9 cm for 35 wks)

FETAL BIOMETRY

BPD:  8.8cm 35w 4d

HC:    32.2cm 36w 3d

AC:    31.0cm 34w 5d

FL:    6.8cm 35w 1d

Current Mean GA: 35w 1d US EDC: 09/04/2021

Assigned GA: 33w 0d Assigned EDC: 09/19/2021

Estimated Fetal Weight:  2,591g 86%ile

FETAL ANATOMY

Lateral Ventricles: Appears normal

Thalami/CSP: Appears normal

Posterior Fossa: Appears normal

Nuchal Region: Previously seen

Upper Lip: Previously seen

Spine: Appears normal

4 Chamber Heart on Left: Previously seen

LVOT: Previously seen

RVOT: Previously seen

Stomach on Left: Appears normal

3 Vessel Cord: Previously seen

Cord Insertion site: Previously seen

Kidneys: Appears normal

Bladder: Appears normal

Extremities: Previously seen

Sex: Previously Seen

Technical Limitations:

Maternal Findings:

Cervix:  4.2 cm TA
IMPRESSION: Assigned GA currently 33 weeks 0 days. EFW is currently at 86 %ile,
raising possibility of developing large for gestational age fetus.
Recommend continued ultrasound follow-up of fetal growth in 3-4
weeks.

No evidence of placenta previa.

Amniotic fluid volume within normal limits, with AFI of 10.9 cm.

## 2021-12-15 IMAGING — US US OB FOLLOW-UP
1 series · 13 of 28 positions shown · non-contrast
Comparison: 08/01/2021

EXAM:
OBSTETRIC 14+ WK ULTRASOUND FOLLOW-UP

CLINICAL DATA: Fetal growth, AFI

[Series 1: us ob follow up · 13 of 38 slices shown]
[im 2/38]
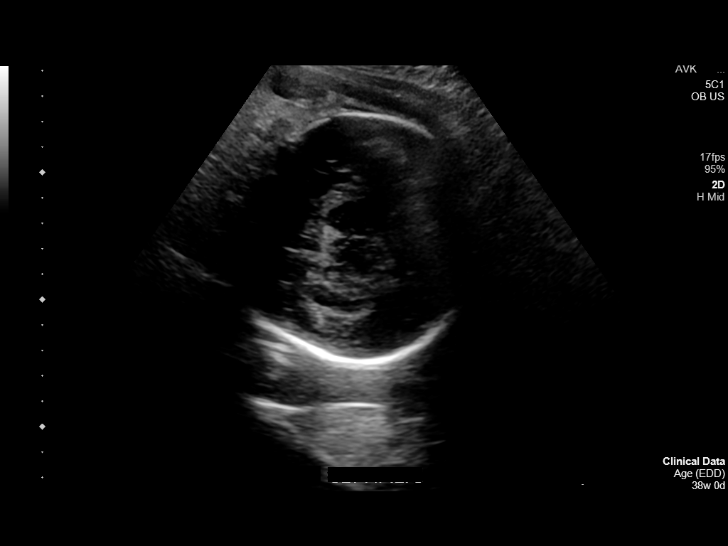
[im 5/38]
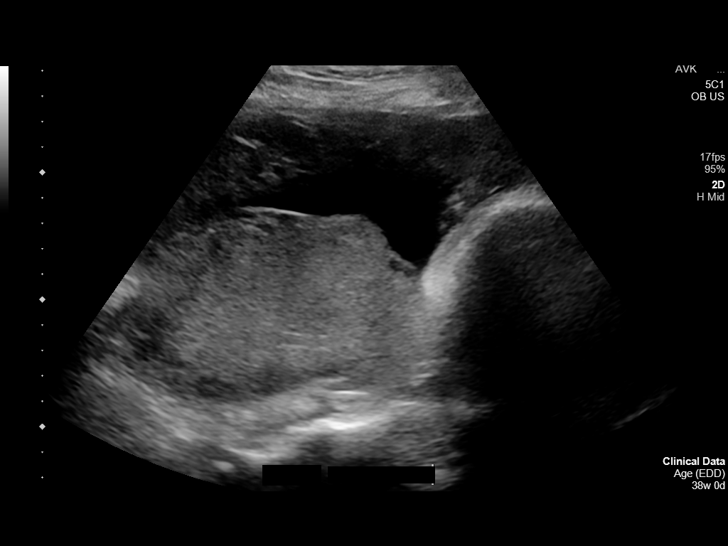
[im 7/38]
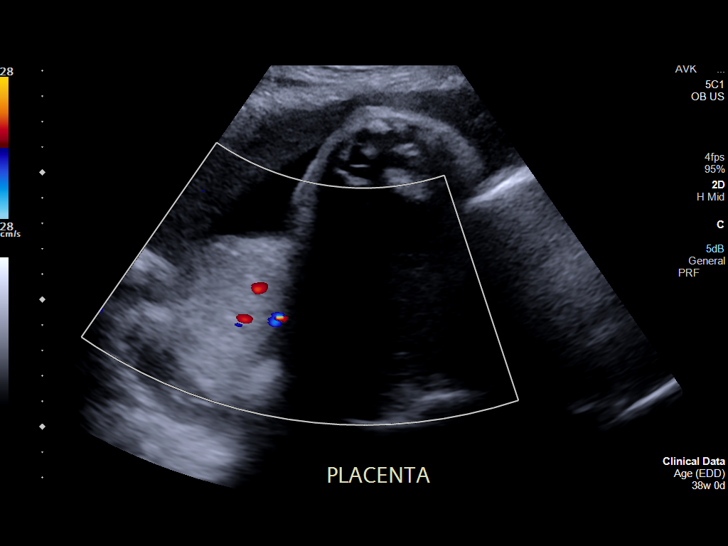
[im 10/38]
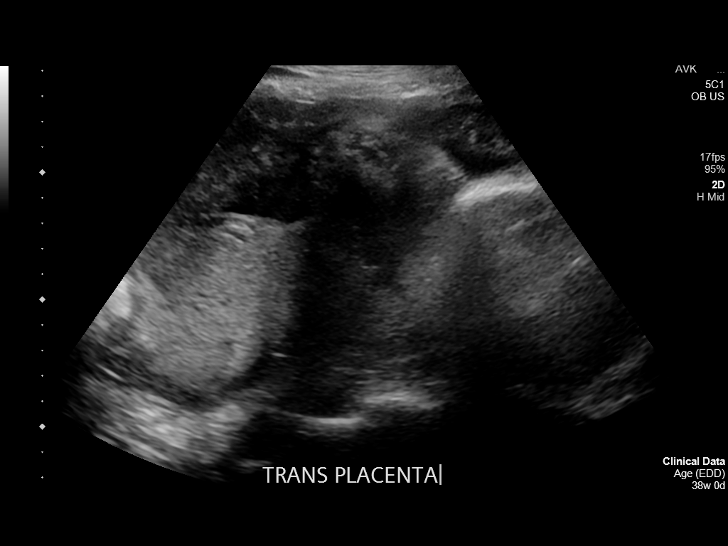
[im 13/38]
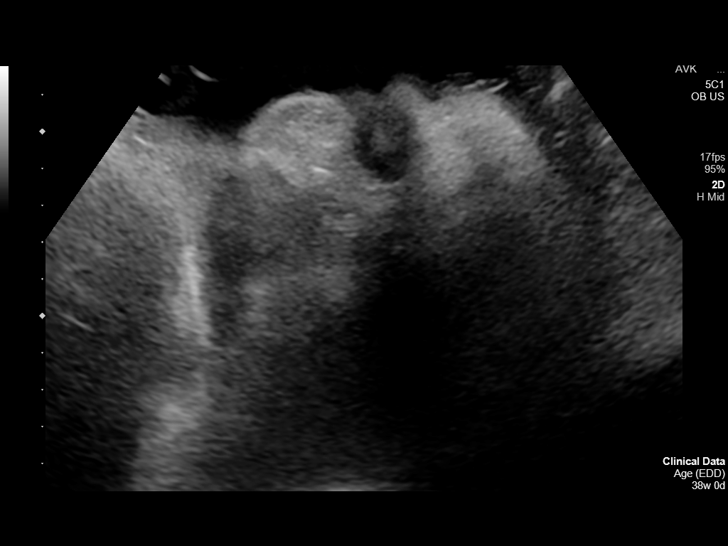
[im 16/38]
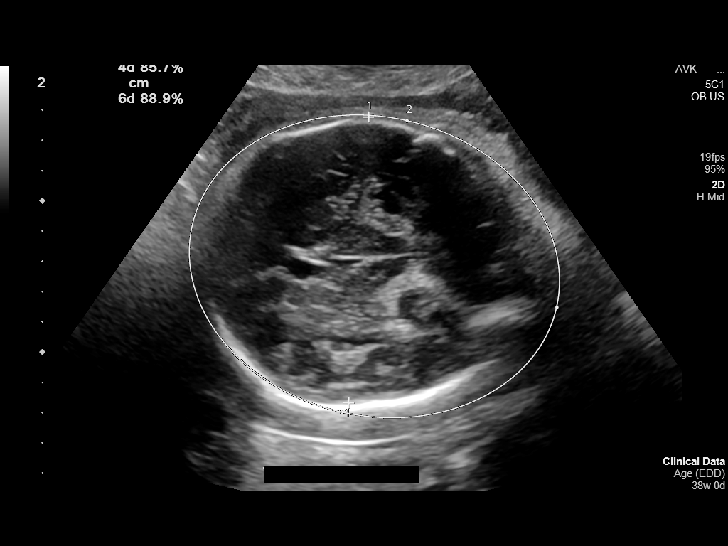
[im 20/38]
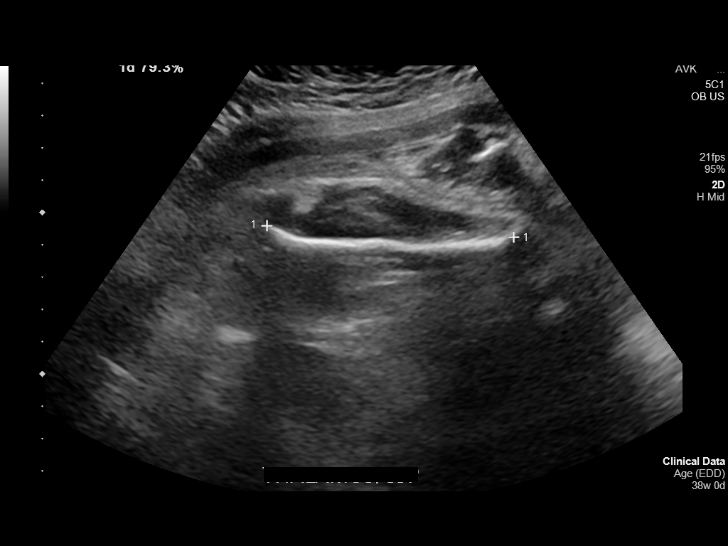
[im 22/38]
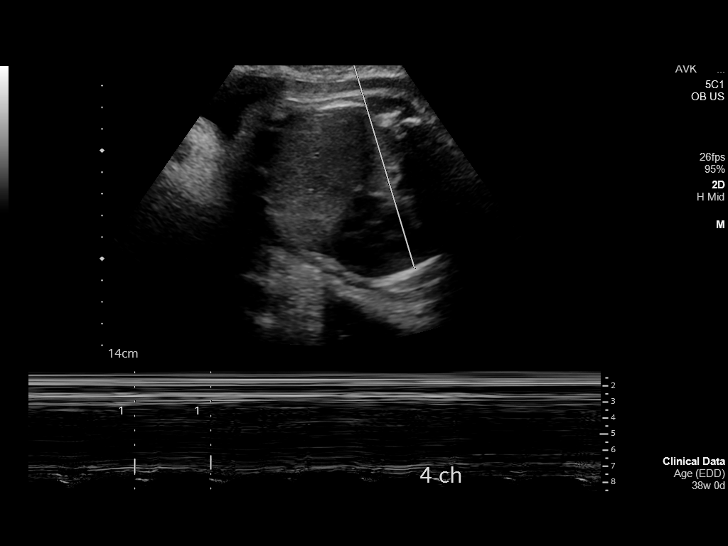
[im 25/38]
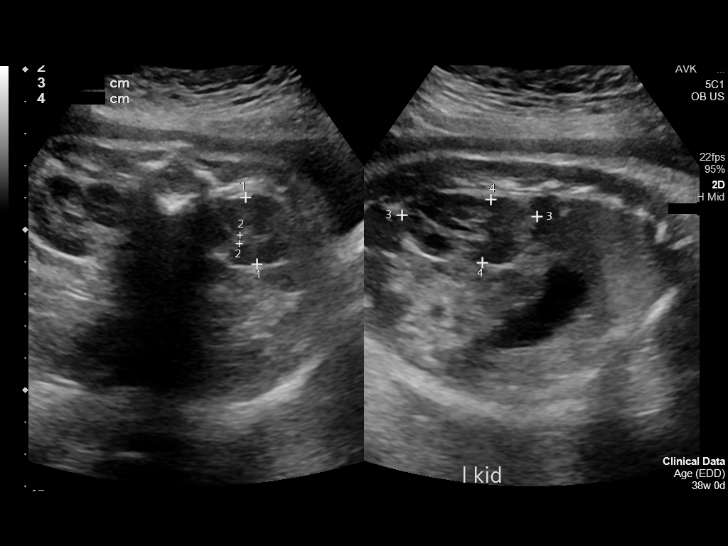
[im 28/38]
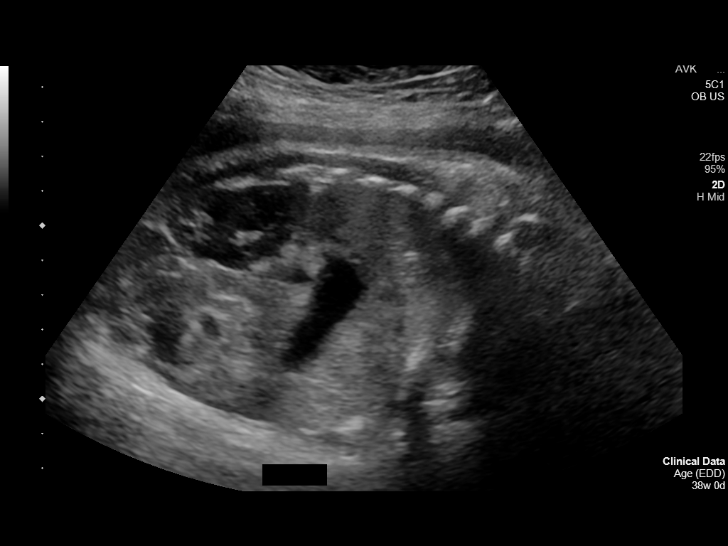
[im 31/38]
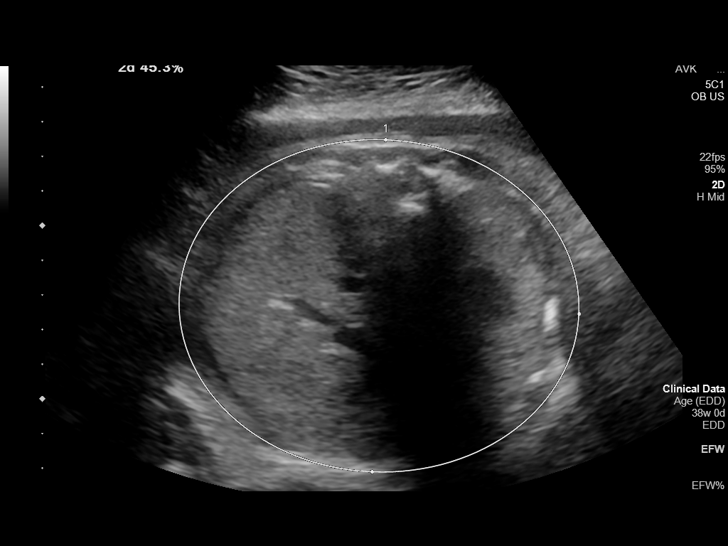
[im 33/38]
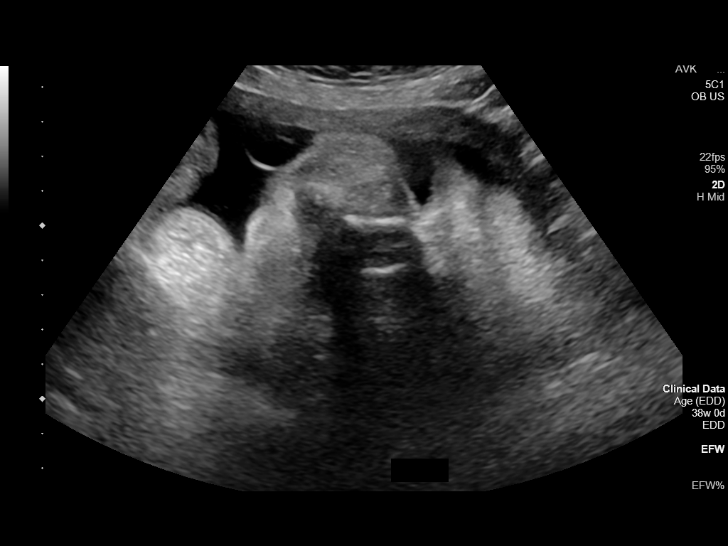
[im 36/38]
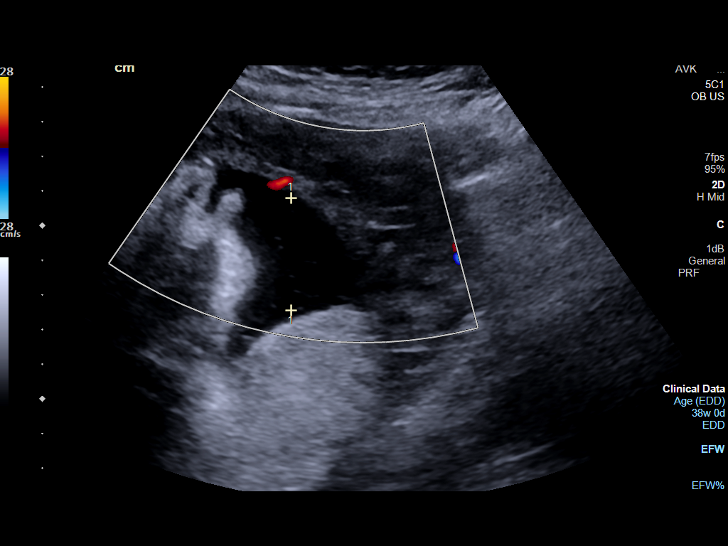

[13 of 28 positions shown; findings below may reference images not displayed]

FINDINGS: Number of Fetuses: 1

Heart Rate:  135 bpm

Movement: 5 yes

Presentation: Cephalic

Previa: No

Placental Location: Posterior

Amniotic Fluid (Subjective): Normal

Amniotic Fluid (Objective):

AFI 8 cm (5%ile= 7.3 cm, 95%= 23.9 cm for 38 wks)

FETAL BIOMETRY

BPD: 9.4cm 38w 2d

HC: 33.6cm  38w   3d

AC: 34.07cm  38w   0d

FL: 7.6cm  38w   6d

Current Mean GA: 38w 1d              US EDC: 09/18/2021

Assigned GA: 38w 0d     Assigned EDC: 09/19/2021

Estimated Fetal Weight: 3447.9g 69.7%ile

FETAL ANATOMY

Lateral Ventricles: Previously seen

Thalami/CSP: Appears normal

Posterior Fossa: Previously seen

Nuchal Region: Previously seen

Upper Lip: Appears normal

Spine: Previously seen

4 Chamber Heart on Left: Previously seen

LVOT: Previously seen

RVOT: Previously seen

Stomach on Left: Appears normal

3 Vessel Cord: Previously seen

Cord Insertion site: Previously seen

Kidneys: Appears normal

Bladder: Appears normal

Extremities: Previously seen

Sex: Male

Technical Limitations: Advanced gestational age

Maternal Findings:

Cervix:  Not evaluated
IMPRESSION: 1. Single live intrauterine pregnancy as detailed above. AFI is
normal, but at the fifth percentile.

## 2025-01-07 ENCOUNTER — Encounter: Payer: Self-pay | Admitting: Family Medicine
